# Patient Record
Sex: Female | Born: 1965 | Race: Black or African American | Hispanic: No | Smoking: Never smoker
Health system: Southern US, Community
[De-identification: ages and names within clinical notes are randomized; demographics above are authoritative.]

## PROBLEM LIST (undated history)

## (undated) DIAGNOSIS — M1990 Unspecified osteoarthritis, unspecified site: Secondary | ICD-10-CM

## (undated) DIAGNOSIS — N189 Chronic kidney disease, unspecified: Secondary | ICD-10-CM

## (undated) DIAGNOSIS — D649 Anemia, unspecified: Secondary | ICD-10-CM

## (undated) DIAGNOSIS — I10 Essential (primary) hypertension: Secondary | ICD-10-CM

## (undated) DIAGNOSIS — J45909 Unspecified asthma, uncomplicated: Secondary | ICD-10-CM

## (undated) HISTORY — DX: Anemia, unspecified: D64.9

## (undated) HISTORY — DX: Chronic kidney disease, unspecified: N18.9

## (undated) HISTORY — DX: Essential (primary) hypertension: I10

## (undated) HISTORY — PX: NO PAST SURGERIES: SHX2092

## (undated) HISTORY — DX: Unspecified osteoarthritis, unspecified site: M19.90

## (undated) HISTORY — DX: Unspecified asthma, uncomplicated: J45.909

## (undated) HISTORY — PX: TUBAL LIGATION: SHX77

---

## 1997-10-29 ENCOUNTER — Other Ambulatory Visit: Admission: RE | Admit: 1997-10-29 | Discharge: 1997-10-29 | Payer: Self-pay | Admitting: Obstetrics & Gynecology

## 1997-11-26 ENCOUNTER — Inpatient Hospital Stay (HOSPITAL_COMMUNITY): Admission: AD | Admit: 1997-11-26 | Discharge: 1997-11-28 | Payer: Self-pay | Admitting: Obstetrics and Gynecology

## 2003-03-09 ENCOUNTER — Emergency Department (HOSPITAL_COMMUNITY): Admission: EM | Admit: 2003-03-09 | Discharge: 2003-03-09 | Payer: Self-pay

## 2004-01-02 ENCOUNTER — Ambulatory Visit (HOSPITAL_COMMUNITY): Admission: RE | Admit: 2004-01-02 | Discharge: 2004-01-02 | Payer: Self-pay | Admitting: Obstetrics

## 2013-06-18 ENCOUNTER — Emergency Department (HOSPITAL_COMMUNITY): Payer: Self-pay

## 2013-06-18 ENCOUNTER — Encounter (HOSPITAL_COMMUNITY): Payer: Self-pay | Admitting: Emergency Medicine

## 2013-06-18 ENCOUNTER — Emergency Department (HOSPITAL_COMMUNITY)
Admission: EM | Admit: 2013-06-18 | Discharge: 2013-06-18 | Disposition: A | Discharge status: Home / Self Care | Payer: Self-pay | Attending: Emergency Medicine | Admitting: Emergency Medicine

## 2013-06-18 DIAGNOSIS — D649 Anemia, unspecified: Secondary | ICD-10-CM | POA: Insufficient documentation

## 2013-06-18 DIAGNOSIS — M542 Cervicalgia: Secondary | ICD-10-CM | POA: Insufficient documentation

## 2013-06-18 DIAGNOSIS — R599 Enlarged lymph nodes, unspecified: Secondary | ICD-10-CM | POA: Insufficient documentation

## 2013-06-18 DIAGNOSIS — K5900 Constipation, unspecified: Secondary | ICD-10-CM | POA: Insufficient documentation

## 2013-06-18 DIAGNOSIS — R634 Abnormal weight loss: Secondary | ICD-10-CM | POA: Insufficient documentation

## 2013-06-18 LAB — CBC WITH DIFFERENTIAL/PLATELET
BASOS ABS: 0.1 10*3/uL (ref 0.0–0.1)
Basophils Relative: 1 % (ref 0–1)
EOS PCT: 2 % (ref 0–5)
Eosinophils Absolute: 0.1 10*3/uL (ref 0.0–0.7)
HEMATOCRIT: 28.2 % — AB (ref 36.0–46.0)
HEMOGLOBIN: 8.3 g/dL — AB (ref 12.0–15.0)
Lymphocytes Relative: 34 % (ref 12–46)
Lymphs Abs: 1.8 10*3/uL (ref 0.7–4.0)
MCH: 22.8 pg — ABNORMAL LOW (ref 26.0–34.0)
MCHC: 29.4 g/dL — AB (ref 30.0–36.0)
MCV: 77.5 fL — ABNORMAL LOW (ref 78.0–100.0)
MONO ABS: 0.4 10*3/uL (ref 0.1–1.0)
Monocytes Relative: 7 % (ref 3–12)
NEUTROS ABS: 3 10*3/uL (ref 1.7–7.7)
Neutrophils Relative %: 57 % (ref 43–77)
Platelets: 302 10*3/uL (ref 150–400)
RBC: 3.64 MIL/uL — ABNORMAL LOW (ref 3.87–5.11)
RDW: 16.4 % — ABNORMAL HIGH (ref 11.5–15.5)
WBC: 5.4 10*3/uL (ref 4.0–10.5)

## 2013-06-18 LAB — POCT I-STAT, CHEM 8
BUN: 9 mg/dL (ref 6–23)
Calcium, Ion: 1.2 mmol/L (ref 1.12–1.23)
Chloride: 102 mEq/L (ref 96–112)
Creatinine, Ser: 0.8 mg/dL (ref 0.50–1.10)
Glucose, Bld: 81 mg/dL (ref 70–99)
HEMATOCRIT: 32 % — AB (ref 36.0–46.0)
Hemoglobin: 10.9 g/dL — ABNORMAL LOW (ref 12.0–15.0)
POTASSIUM: 4.1 meq/L (ref 3.7–5.3)
SODIUM: 139 meq/L (ref 137–147)
TCO2: 26 mmol/L (ref 0–100)

## 2013-06-18 MED ORDER — IOHEXOL 300 MG/ML  SOLN
100.0000 mL | Freq: Once | INTRAMUSCULAR | Status: AC | PRN
  Administered 2013-06-18: 100 mL via INTRAVENOUS

## 2013-06-18 NOTE — ED Notes (Signed)
Pt c/o "knot" on R posterior neck increasing in size and pain x 6 months.  Pain score 4/10.

## 2013-06-18 NOTE — Discharge Instructions (Signed)
Read the information below.  You may return to the Emergency Department at any time for worsening condition or any new symptoms that concern you. If you become weak, lightheaded, short of breath, you pass out, or have uncontrolled bleeding, return to the ER immediately for a recheck.  Anemia, Nonspecific Anemia is a condition in which the concentration of red blood cells or hemoglobin in the blood is below normal. Hemoglobin is a substance in red blood cells that carries oxygen to the tissues of the body. Anemia results in not enough oxygen reaching these tissues.  CAUSES  Common causes of anemia include:   Excessive bleeding. Bleeding may be internal or external. This includes excessive bleeding from periods (in women) or from the intestine.   Poor nutrition.   Chronic kidney, thyroid, and liver disease.  Bone marrow disorders that decrease red blood cell production.  Cancer and treatments for cancer.  HIV, AIDS, and their treatments.  Spleen problems that increase red blood cell destruction.  Blood disorders.  Excess destruction of red blood cells due to infection, medicines, and autoimmune disorders. SIGNS AND SYMPTOMS   Minor weakness.   Dizziness.   Headache.  Palpitations.   Shortness of breath, especially with exercise.   Paleness.  Cold sensitivity.  Indigestion.  Nausea.  Difficulty sleeping.  Difficulty concentrating. Symptoms may occur suddenly or they may develop slowly.  DIAGNOSIS  Additional blood tests are often needed. These help your health care provider determine the best treatment. Your health care provider will check your stool for blood and look for other causes of blood loss.  TREATMENT  Treatment varies depending on the cause of the anemia. Treatment can include:   Supplements of iron, vitamin B12, or folic acid.   Hormone medicines.   A blood transfusion. This may be needed if blood loss is severe.   Hospitalization. This  may be needed if there is significant continual blood loss.   Dietary changes.  Spleen removal. HOME CARE INSTRUCTIONS Keep all follow-up appointments. It often takes many weeks to correct anemia, and having your health care provider check on your condition and your response to treatment is very important. SEEK IMMEDIATE MEDICAL CARE IF:   You develop extreme weakness, shortness of breath, or chest pain.   You become dizzy or have trouble concentrating.  You develop heavy vaginal bleeding.   You develop a rash.   You have bloody or black, tarry stools.   You faint.   You vomit up blood.   You vomit repeatedly.   You have abdominal pain.  You have a fever or persistent symptoms for more than 2 3 days.   You have a fever and your symptoms suddenly get worse.   You are dehydrated.  MAKE SURE YOU:  Understand these instructions.  Will watch your condition.  Will get help right away if you are not doing well or get worse. Document Released: 06/24/2004 Document Revised: 01/17/2013 Document Reviewed: 11/10/2012 [facility] Patient Information 2014 [place].   Emergency Department Resource Guide 1) Find a Doctor and Pay Out of Pocket Although you won't have to find out who is covered by your insurance plan, it is a good idea to ask around and get recommendations. You will then need to call the office and see if the doctor you have chosen will accept you as a new patient and what types of options they offer for patients who are self-pay. Some doctors offer discounts or will set up payment plans for their patients  who do not have insurance, but you will need to ask so you aren't surprised when you get to your appointment.  2) Contact Your Local Health Department Not all health departments have doctors that can see patients for sick visits, but many do, so it is worth a call to see if yours does. If you don't know where your local health department is, you can  check in your phone book. The CDC also has a tool to help you locate your state's health department, and many state websites also have listings of all of their local health departments.  3) Find a Walk-in Clinic If your illness is not likely to be very severe or complicated, you may want to try a walk in clinic. These are popping up all over the country in pharmacies, drugstores, and shopping centers. They're usually staffed by nurse practitioners or physician assistants that have been trained to treat common illnesses and complaints. They're usually fairly quick and inexpensive. However, if you have serious medical issues or chronic medical problems, these are probably not your best option.  No Primary Care Doctor: - Call Health Connect at  [phone] - they can help you locate a primary care doctor that  accepts your insurance, provides certain services, etc. - Physician Referral Service- [phone]  Chronic Pain Problems: Organization         Address  Phone   Notes  [facility]  [phone] Patients need to be referred by their primary care doctor.   Medication Assistance: Organization         Address  Phone   Notes  [facility] [address] [place], [place] [address] [phone] --Must be a resident of [facility] -- Must have NO insurance coverage whatsoever (no Medicaid/ Medicare, etc.) -- The pt. MUST have a primary care doctor that directs their care regularly and follows them in the community   [facility]  [phone]   [employer]  [phone]    Agencies that provide inexpensive medical care: Organization         Address  Phone   Notes  [facility]  [phone]   [facility]    [phone]   [facility] [address] [place], [place] [address] [phone]   [facility] [address] [place] [place] [phone]    Planned Parenthood    [phone]   [facility]    [phone]   [facility] and [facility]  [address], [place] Phone:  [phone], Fax:  [phone] Hours of Operation:  9 am - 6 pm, M-F.  Also accepts Medicaid/Medicare and self-pay.  [facility]  [address], [place] Phone: [phone], Fax: [phone]. Hours of Operation:  8:30 am - 5:30 pm, M-F.  Also accepts Medicaid and self-pay.  [facility] [place] [address], [place] Phone: [phone]   [facility] [address] Natasha Bence [place], [place] [phone], Ext. [phone] Mondays & Thursdays: 7-9 AM.  First 15 patients are seen on a first come, first serve basis.    Medicaid-accepting [facility] Providers:  Organization         Address  Phone   Notes  [facility] [address], [place] [phone] Also accepts self-pay patients.  [facility] [address] Laurell Josephs [place], [place]  [phone]   [facility] [address], [place] [phone]   [facility] [address], [place] [phone]   Renaye Rakers [address], [place]   [phone] Only accepts [place] Access [place] patients after they have their name applied to their card.   Self-Pay (no insurance) in [facility]:  Organization         Address  Phone   Notes  Sickle Cell Patients, [facility] Internal Medicine [address] [place], [place] [phone]   [facility] [address] [place], [place] [phone]   [facility] [place]  [address], [place] [phone]   [facility]/Dr. Osei-Bonsu  [address] [place] or [address], [place] [phone] Phone number for both [place] and [place] locations is the  same.  [facility]gent [facility] and [facility] [address] [place] [phone]   [facility] [address] [place] or [address] [phone]   [facility] [address] [place] [phone], phone; [phone], fax Sees patients 1st and 3rd Saturday of every month.  Must not qualify for public or private insurance (i.e. Medicaid, Medicare, [place] Health Choice, Veterans' Benefits)  Household income should be no more than 200% of the poverty level The clinic cannot treat you if you are pregnant or think you are pregnant  Sexually transmitted diseases are not treated at the clinic.    Dental Care: Organization         Address  Phone  Notes  [facility] Department of [facility] [address] [place], [place] [phone] Accepts children up to age 5 who are enrolled in [place] or [place] Health Choice; pregnant women with a Medicaid card; and children who have applied for Medicaid or [place] Health Choice, but were declined, whose parents can pay a reduced fee at time of service.  [facility] Department of [facility]  [address], [place] [phone] Accepts children up to age 59 who are enrolled in [place] or [place] Health Choice; pregnant women with a Medicaid card; and children who have applied for Medicaid or [place] Health Choice, but were declined, whose parents can pay a reduced fee at time of service.  [facility]  [address] [place], [place] [phone] Patients are seen by appointment only. Walk-ins are not accepted. [facility] will see patients 79 years of age and older. Monday - Tuesday (8am-5pm) Most Wednesdays (8:30-5pm) $30 per visit, cash only  [facility] Adult Dental Access PROGRAM  [address], [facility] [phone] Patients are seen by appointment only. Walk-ins are not accepted. [facility] will see patients  47 years of age and older. One Wednesday Evening (Monthly: Volunteer Based).  $30 per visit, cash only  [employer]  [phone] for adults; Children under age 75, call Graduate Pediatric Dentistry at [phone]. Children aged 2-14, please call [phone] to request a pediatric application.  Dental services are provided in all areas of dental care including fillings, crowns and bridges, complete and partial dentures, implants, gum treatment, root canals, and extractions. Preventive care is also provided. Treatment is provided to both adults and children.  Patients are selected via a lottery and there is often a waiting list.   [facility] [address], [place]  [phone] [email or web address]   [facility] [address] [place], [place] [phone], Ext. [phone] Second and Fourth Thursday of each month, opens at 6:30 AM; Clinic ends at 9 AM.  Patients are seen on a first-come first-served basis, and a limited number are seen during each clinic.   [facility]  [address] Ether Griffins [place], [place] [phone]   Eligibility Requirements You must have lived in [place], [place], or [place] counties for at least the last three months.   You cannot be eligible for state or federal sponsored [place], including [employer], [place], or [employer].   You generally cannot be eligible for healthcare insurance through your employer.    How to apply: Eligibility screenings are held every Tuesday and Wednesday afternoon from 1:00 pm until 4:00 pm. You do not need an appointment for the interview!  [facility] [address] [place], [place] [phone]   [facility] Health Department  [phone]   [facility] Health Department  [phone]   [facility] Health Department  [phone]    Behavioral Health Resources in the Community: Intensive Outpatient  Programs Organization         Address  Phone  Notes  [facility] Services [address] [place], [place] [phone]   [facility] Outpatient [address] [place], [place] [phone]   [facility]: [facility] [address], [place], [place]  [phone]   [facility] Mental Health [address]  [place], [place] [phone] or [phone]   Substance Abuse Resources Organization         Address  Phone  Notes  [facility] Drug Services  [phone]   [facility]  [phone]   [facility]  [phone]   Floydene Flock  [phone]   Residential & Outpatient Substance Abuse Program  [phone]   Psychological Services Organization         Address  Phone  Notes  [facility]al Health  [phone]   [facility]  [phone]   [facility] Mental Health [address] [place] [phone] or [phone]    Mobile Crisis Teams Organization         Address  Phone  Notes  [facility], Mobile Crisis Care Unit  [phone]   [facility]  [address] [place], [place] [phone]   Doristine Locks [address] [place] [phone]    Self-Help/Support Groups Organization         Address  Phone             Notes  [facility] [place] - variety of support groups  [phone]- I7437963 Call for more information  Narcotics Anonymous (NA), [facility] [address] [place] [address], [employer] [place]  2 meetings at this location   Statistician         Address  Phone  Notes  [facility] [address],    [place]  [phone]   [facility]  [address] [place] [address], [place], [place] [phone]   [facility] [address] [place], [place] [phone] Admissions: 8am-3pm M-F  [facility] [address]    [place], [place]  [phone]   [facility]  [address] Leonard Schwartz [place], [place] [phone]   [facility] [address],  [place], [place] [phone]   [facility] - Intensive Outpatient [address] Laurell Josephs [address], [place], [place] [phone]   [facility] (Addiction Recovery [facility] [address] [place]  [place], [place] [phone] or [phone]   [facility] ([facility]) [address], [place], [place] [phone] Accepts Medicaid  [facility] [address]  [place] [phone] Substance Abuse/Addiction Treatment   [facility] Organization         Address  Phone  Notes  [facility]  [phone]   Angie Fava, PhD [address] Ervin Knack [place], [place]   [phone] or [phone]   [facility] Behavioral   [address] [place], [place] [phone]   [facility] [address], [place], [place] [phone] Insurance/Medicaid/sponsorship through [facility] and Families [address]                                    [place], [place] [phone] Therapy/tele-psych/case  [facility] [address] [place], [place] [phone]    Dr. Lolly Mustache  [phone]   [facility]  [employer] [facility] Dept. 1) [address] [place] 2) [address], [place] 3)  [address], [place] [phone]  [phone]   [facility] Child Abuse Hotline 718-788-7278 or [phone] (After Hours)

## 2013-06-18 NOTE — ED Provider Notes (Signed)
Medical screening examination/treatment/procedure(s) were performed by non-physician practitioner and as supervising physician I was immediately available for consultation/collaboration.  EKG Interpretation   None        Alecxander Mainwaring R. Lyndon Chapel, MD 06/18/13 2354 

## 2013-06-18 NOTE — ED Provider Notes (Signed)
CSN: [id number]     Arrival date & time 06/18/13  1320 History  This chart was scribed for non-physician practitioner, Trixie Dredge, PA-C working with Ethelda Chick, MD by Greggory Stallion, ED scribe. This patient was seen in room WTR8/WTR8 and the patient's care was started at 2:27 PM.   Chief Complaint  Patient presents with  . Knot on neck    The history is provided by the patient. No language interpreter was used.   HPI Comments: Tammy Huang is a 48 y.o. female who presents to the Emergency Department complaining of a knot to the right side of her neck that started 9 months ago as a small lump that has gradually grown in size. Pt states there was no pain when she first noticed the bump. She states it has grown in size over the last 3-4 months and she is now having increased pain to the area. Pt states she has lost 30 pounds over the last year without trying. She has not changed what she was eating. Pt has been getting full faster than usual. Her last bowel movement was over one week ago. Denies any scalp lesions. Denies fever, chills, night sweats, dental pain, sore throat, congestion, sinus pressure, ear pain, ear discharge, cough, hemoptysis, SOB, abdominal pain, hematochezia, generalized body aches, weakness or numbness in her arm. Pt states she has been anemic for the last year and has intermittent light headedness because of it. She states she normally has very heavy menses. Denies black, tarry stools, weakness. Denies any other source of bleeding. Denies history of smoking cigarettes.   History reviewed. No pertinent past medical history. History reviewed. No pertinent past surgical history. History reviewed. No pertinent family history. History  Substance Use Topics  . Smoking status: Never Smoker   . Smokeless tobacco: Never Used  . Alcohol Use: No   OB History   Grav Para Term Preterm Abortions TAB SAB Ect Mult Living                 Review of Systems  Constitutional: Positive  for unexpected weight change. Negative for fever, chills and diaphoresis.  HENT: Negative for congestion, dental problem, ear discharge, ear pain, sinus pressure and sore throat.   Respiratory: Negative for cough and shortness of breath.   Gastrointestinal: Positive for constipation. Negative for abdominal pain and blood in stool.  Musculoskeletal: Positive for neck pain. Negative for myalgias.  Neurological: Negative for weakness and numbness.  All other systems reviewed and are negative.    Allergies  Review of patient's allergies indicates not on file.  Home Medications  No current outpatient prescriptions on file.  BP 142/87  Pulse 83  Temp(Src) 98.6 F (37 C) (Oral)  Resp 16  SpO2 100%  LMP 06/12/2013  Physical Exam  Nursing note and vitals reviewed. Constitutional: She appears well-developed and well-nourished. No distress.  HENT:  Head: Normocephalic and atraumatic.  Right Ear: Tympanic membrane and ear canal normal.  Left Ear: Tympanic membrane and ear canal normal.  Mouth/Throat: No oral lesions.  No tenderness of the scalp. No lesions noted. No lesions in the oropharynx.   Neck: Neck supple.  Pulmonary/Chest: Effort normal.  Lymphadenopathy:  No preauricular or postauricular lymphadenopathy. No supraclavicular lympha. No right axillary or right epitrochlear lymphadenopathy. No occiptal lymphadenopathy. Enlarged lymph node on right posterior chain. It is tender and mobile.   Neurological: She is alert.  Skin: She is not diaphoretic.    ED Course  Procedures (including critical  care time)  DIAGNOSTIC STUDIES: Oxygen Saturation is 100% on RA, normal by my interpretation.    COORDINATION OF CARE: 2:36 PM-Discussed treatment plan which includes CT scan with pt at bedside and pt agreed to plan.   Labs Review Labs Reviewed  CBC WITH DIFFERENTIAL - Abnormal; Notable for the following:    RBC 3.64 (*)    Hemoglobin 8.3 (*)    HCT 28.2 (*)    MCV 77.5 (*)     MCH 22.8 (*)    MCHC 29.4 (*)    RDW 16.4 (*)    All other components within normal limits  POCT I-STAT, CHEM 8 - Abnormal; Notable for the following:    Hemoglobin 10.9 (*)    HCT 32.0 (*)    All other components within normal limits   Imaging Review Ct Soft Tissue Neck W Contrast  06/18/2013   CLINICAL DATA:  Knot in right side of neck and right shoulder pain.  EXAM: CT NECK WITH CONTRAST  TECHNIQUE: Multidetector CT imaging of the neck was performed using the standard protocol following the bolus administration of intravenous contrast.  CONTRAST:  100mL OMNIPAQUE IOHEXOL 300 MG/ML  SOLN  COMPARISON:  None.  FINDINGS: Suprahyoid neck:  Normal.  Larynx:  Normal.  Infrahyoid neck:  Normal.  Lymph nodes: There are no lymph nodes visualized with short axis greater than 7 mm. A shotty posterior cervical lymph node level V on the right measures 5 mm short axis.  Upper chest/mediastinum: No pathologic abnormality. There is a prominent venous structure lateral to the aorta incompletely evaluated suggesting left superior intercostal vein or aortic nipple.  Additional: Normal osseous structures. Visualized intracranial compartment unremarkable. No sinus, mastoid, or orbital findings.  IMPRESSION: No pathologic adenopathy or posterior cervical mass.   Electronically Signed   By: Davonna BellingJohn  Curnes M.D.   On: 06/18/2013 15:25    EKG Interpretation   None       MDM   1. Neck pain on right side   2. Anemia    Pt without PCP with right posterior cervical lymph node that she reports has steadily grown in size over 9 months without any apparent source of infection.  She has had some unexpected weight loss but no other constitutional/"B" symptoms.  As she had no PCP for follow up, I ordered CBC, chem 8, CT neck for further evaluation.  CT was unremarkable, lymph node measures very small, no abnormal appearance.  Labs remarkable only for anemia, which patient is aware of and is having no symptoms from.  I did  encourage her to follow up with PCP and gynecology.  Resources given.  D/C home.  Discussed result, findings, treatment, and follow up  with patient.  Pt given return precautions.  Pt verbalizes understanding and agrees with plan.      I personally performed the services described in this documentation, which was scribed in my presence. The recorded information has been reviewed and is accurate.   Trixie Dredgemily Malky Rudzinski, PA-C 06/18/13 1811

## 2015-12-10 ENCOUNTER — Other Ambulatory Visit: Payer: Self-pay | Admitting: Family Medicine

## 2015-12-10 DIAGNOSIS — Z1231 Encounter for screening mammogram for malignant neoplasm of breast: Secondary | ICD-10-CM

## 2015-12-11 ENCOUNTER — Encounter: Payer: Self-pay | Admitting: Gastroenterology

## 2015-12-16 ENCOUNTER — Ambulatory Visit
Admission: RE | Admit: 2015-12-16 | Discharge: 2015-12-16 | Disposition: A | Discharge status: Home / Self Care | Payer: BC Managed Care – PPO | Source: Ambulatory Visit | Attending: Family Medicine | Admitting: Family Medicine

## 2015-12-16 DIAGNOSIS — Z1231 Encounter for screening mammogram for malignant neoplasm of breast: Secondary | ICD-10-CM

## 2016-02-03 ENCOUNTER — Ambulatory Visit (AMBULATORY_SURGERY_CENTER): Payer: Self-pay

## 2016-02-03 VITALS — Ht 62.0 in | Wt 230.0 lb

## 2016-02-03 DIAGNOSIS — Z1211 Encounter for screening for malignant neoplasm of colon: Secondary | ICD-10-CM

## 2016-02-03 MED ORDER — SUPREP BOWEL PREP KIT 17.5-3.13-1.6 GM/177ML PO SOLN
1.0000 | Freq: Once | ORAL | 0 refills | Status: AC
Start: 2016-02-03 — End: 2016-02-03

## 2016-02-03 NOTE — Progress Notes (Signed)
No allergies to eggs or soy No home oxygen No diet meds No past exposure to anesthesia  Declined emmi 

## 2016-02-04 ENCOUNTER — Encounter: Payer: Self-pay | Admitting: Gastroenterology

## 2016-02-17 ENCOUNTER — Ambulatory Visit (AMBULATORY_SURGERY_CENTER): Payer: BC Managed Care – PPO | Admitting: Gastroenterology

## 2016-02-17 ENCOUNTER — Encounter: Payer: Self-pay | Admitting: Gastroenterology

## 2016-02-17 VITALS — BP 136/88 | HR 56 | Temp 98.0°F | Resp 12 | Ht 62.0 in | Wt 230.0 lb

## 2016-02-17 DIAGNOSIS — Z1211 Encounter for screening for malignant neoplasm of colon: Secondary | ICD-10-CM

## 2016-02-17 MED ORDER — SODIUM CHLORIDE 0.9 % IV SOLN
500.0000 mL | INTRAVENOUS | Status: AC
Start: 2016-02-17 — End: ?

## 2016-02-17 NOTE — Op Note (Signed)
[facility] Patient Name: Tammy Huang Procedure Date: 02/17/2016 9:22 AM MRN: [id number] Endoscopist: Meryl Dare , MD Age: 50 Referring MD:  Date of Birth: 1965/08/31 Gender: Female Account #: [id number] Procedure:                Colonoscopy Indications:              Screening for colorectal malignant neoplasm Medicines:                Monitored Anesthesia Care Procedure:                Pre-Anesthesia Assessment:                           - Prior to the procedure, a History and Physical                            was performed, and patient medications and                            allergies were reviewed. The patient's tolerance of                            previous anesthesia was also reviewed. The risks                            and benefits of the procedure and the sedation                            options and risks were discussed with the patient.                            All questions were answered, and informed consent                            was obtained. Prior Anticoagulants: The patient has                            taken no previous anticoagulant or antiplatelet                            agents. ASA Grade Assessment: II - A patient with                            mild systemic disease. After reviewing the risks                            and benefits, the patient was deemed in                            satisfactory condition to undergo the procedure.                           After obtaining informed consent, the colonoscope  was passed under direct vision. Throughout the                            procedure, the patient's blood pressure, pulse, and                            oxygen saturations were monitored continuously. The                            Model PCF-H190L [id number]) scope was introduced                            through the anus and advanced to the the cecum,                            identified by  appendiceal orifice and ileocecal                            valve. The ileocecal valve, appendiceal orifice,                            and rectum were photographed. The quality of the                            bowel preparation was good. The colonoscopy was                            performed without difficulty. The patient tolerated                            the procedure well. Scope In: 9:28:55 AM Scope Out: 9:39:27 AM Scope Withdrawal Time: 0 hours 8 minutes 43 seconds  Total Procedure Duration: 0 hours 10 minutes 32 seconds  Findings:                 The entire examined colon appeared normal on direct                            and retroflexion views. Complications:            No immediate complications. Estimated blood loss:                            None. Estimated Blood Loss:     Estimated blood loss: none. Impression:               - The entire examined colon is normal on direct and                            retroflexion views.                           - No specimens collected. Recommendation:           - Repeat colonoscopy in 10 years for screening  purposes.                           - Patient has a contact number available for                            emergencies. The signs and symptoms of potential                            delayed complications were discussed with the                            patient. Return to normal activities tomorrow.                            Written discharge instructions were provided to the                            patient.                           - Resume previous diet.                           - Continue present medications. Meryl Dare, MD 02/17/2016 9:42:16 AM This report has been signed electronically.

## 2016-02-17 NOTE — Progress Notes (Signed)
To recovery, report to McCoy, RN, VSS 

## 2016-02-17 NOTE — Patient Instructions (Signed)
Discharge instructions given. Normal exam. Resume previous medications. YOU HAD AN ENDOSCOPIC PROCEDURE TODAY AT THE [facility]:   Refer to the procedure report that was given to you for any specific questions about what was found during the examination.  If the procedure report does not answer your questions, please call your gastroenterologist to clarify.  If you requested that your care partner not be given the details of your procedure findings, then the procedure report has been included in a sealed envelope for you to review at your convenience later.  YOU SHOULD EXPECT: Some feelings of bloating in the abdomen. Passage of more gas than usual.  Walking can help get rid of the air that was put into your GI tract during the procedure and reduce the bloating. If you had a lower endoscopy (such as a colonoscopy or flexible sigmoidoscopy) you may notice spotting of blood in your stool or on the toilet paper. If you underwent a bowel prep for your procedure, you may not have a normal bowel movement for a few days.  Please Note:  You might notice some irritation and congestion in your nose or some drainage.  This is from the oxygen used during your procedure.  There is no need for concern and it should clear up in a day or so.  SYMPTOMS TO REPORT IMMEDIATELY:   Following lower endoscopy (colonoscopy or flexible sigmoidoscopy):  Excessive amounts of blood in the stool  Significant tenderness or worsening of abdominal pains  Swelling of the abdomen that is new, acute  Fever of 100F or higher   For urgent or emergent issues, a gastroenterologist can be reached at any hour by calling [phone].   DIET:  We do recommend a small meal at first, but then you may proceed to your regular diet.  Drink plenty of fluids but you should avoid alcoholic beverages for 24 hours.  ACTIVITY:  You should plan to take it easy for the rest of today and you should NOT DRIVE or use heavy machinery  until tomorrow (because of the sedation medicines used during the test).    FOLLOW UP: Our staff will call the number listed on your records the next business day following your procedure to check on you and address any questions or concerns that you may have regarding the information given to you following your procedure. If we do not reach you, we will leave a message.  However, if you are feeling well and you are not experiencing any problems, there is no need to return our call.  We will assume that you have returned to your regular daily activities without incident.  If any biopsies were taken you will be contacted by phone or by letter within the next 1-3 weeks.  Please call us at [phone] if you have not heard about the biopsies in 3 weeks.    SIGNATURES/CONFIDENTIALITY: You and/or your care partner have signed paperwork which will be entered into your electronic medical record.  These signatures attest to the fact that that the information above on your After Visit Summary has been reviewed and is understood.  Full responsibility of the confidentiality of this discharge information lies with you and/or your care-partner. 

## 2016-02-18 ENCOUNTER — Telehealth: Payer: Self-pay

## 2016-02-18 NOTE — Telephone Encounter (Signed)
  Follow up Call-  Call back number 02/17/2016  Post procedure Call Back phone  # [phone]  Permission to leave phone message Yes  Some recent data might be hidden     Patient questions:  Do you have a fever, pain , or abdominal swelling? No. Pain Score  0 *  Have you tolerated food without any problems? Yes.    Have you been able to return to your normal activities? Yes.    Do you have any questions about your discharge instructions: Diet   No. Medications  No. Follow up visit  No.  Do you have questions or concerns about your Care? No.  Actions: * If pain score is 4 or above: No action needed, pain <4.

## 2016-12-15 ENCOUNTER — Other Ambulatory Visit: Payer: Self-pay | Admitting: Family Medicine

## 2016-12-15 DIAGNOSIS — Z1231 Encounter for screening mammogram for malignant neoplasm of breast: Secondary | ICD-10-CM

## 2016-12-29 ENCOUNTER — Ambulatory Visit
Admission: RE | Admit: 2016-12-29 | Discharge: 2016-12-29 | Disposition: A | Discharge status: Home / Self Care | Payer: BC Managed Care – PPO | Source: Ambulatory Visit | Attending: Family Medicine | Admitting: Family Medicine

## 2016-12-29 DIAGNOSIS — Z1231 Encounter for screening mammogram for malignant neoplasm of breast: Secondary | ICD-10-CM

## 2017-06-16 ENCOUNTER — Other Ambulatory Visit: Payer: Self-pay | Admitting: Family Medicine

## 2017-06-16 DIAGNOSIS — Z1231 Encounter for screening mammogram for malignant neoplasm of breast: Secondary | ICD-10-CM

## 2017-07-28 ENCOUNTER — Other Ambulatory Visit: Payer: Self-pay | Admitting: Family Medicine

## 2017-07-28 ENCOUNTER — Ambulatory Visit
Admission: RE | Admit: 2017-07-28 | Discharge: 2017-07-28 | Disposition: A | Discharge status: Home / Self Care | Payer: BC Managed Care – PPO | Source: Ambulatory Visit | Attending: Family Medicine | Admitting: Family Medicine

## 2017-07-28 DIAGNOSIS — M170 Bilateral primary osteoarthritis of knee: Secondary | ICD-10-CM

## 2017-12-30 ENCOUNTER — Ambulatory Visit: Payer: BC Managed Care – PPO

## 2018-01-25 ENCOUNTER — Ambulatory Visit
Admission: RE | Admit: 2018-01-25 | Discharge: 2018-01-25 | Disposition: A | Discharge status: Home / Self Care | Payer: BC Managed Care – PPO | Source: Ambulatory Visit | Attending: Family Medicine | Admitting: Family Medicine

## 2018-01-25 DIAGNOSIS — Z1231 Encounter for screening mammogram for malignant neoplasm of breast: Secondary | ICD-10-CM

## 2019-04-12 IMAGING — MG DIGITAL SCREENING BILATERAL MAMMOGRAM WITH CAD
4 series · 4 of 4 positions shown · non-contrast
Comparison: Previous exam(s).

CLINICAL DATA: Screening.

EXAM:
DIGITAL SCREENING BILATERAL MAMMOGRAM WITH CAD

[L CC]
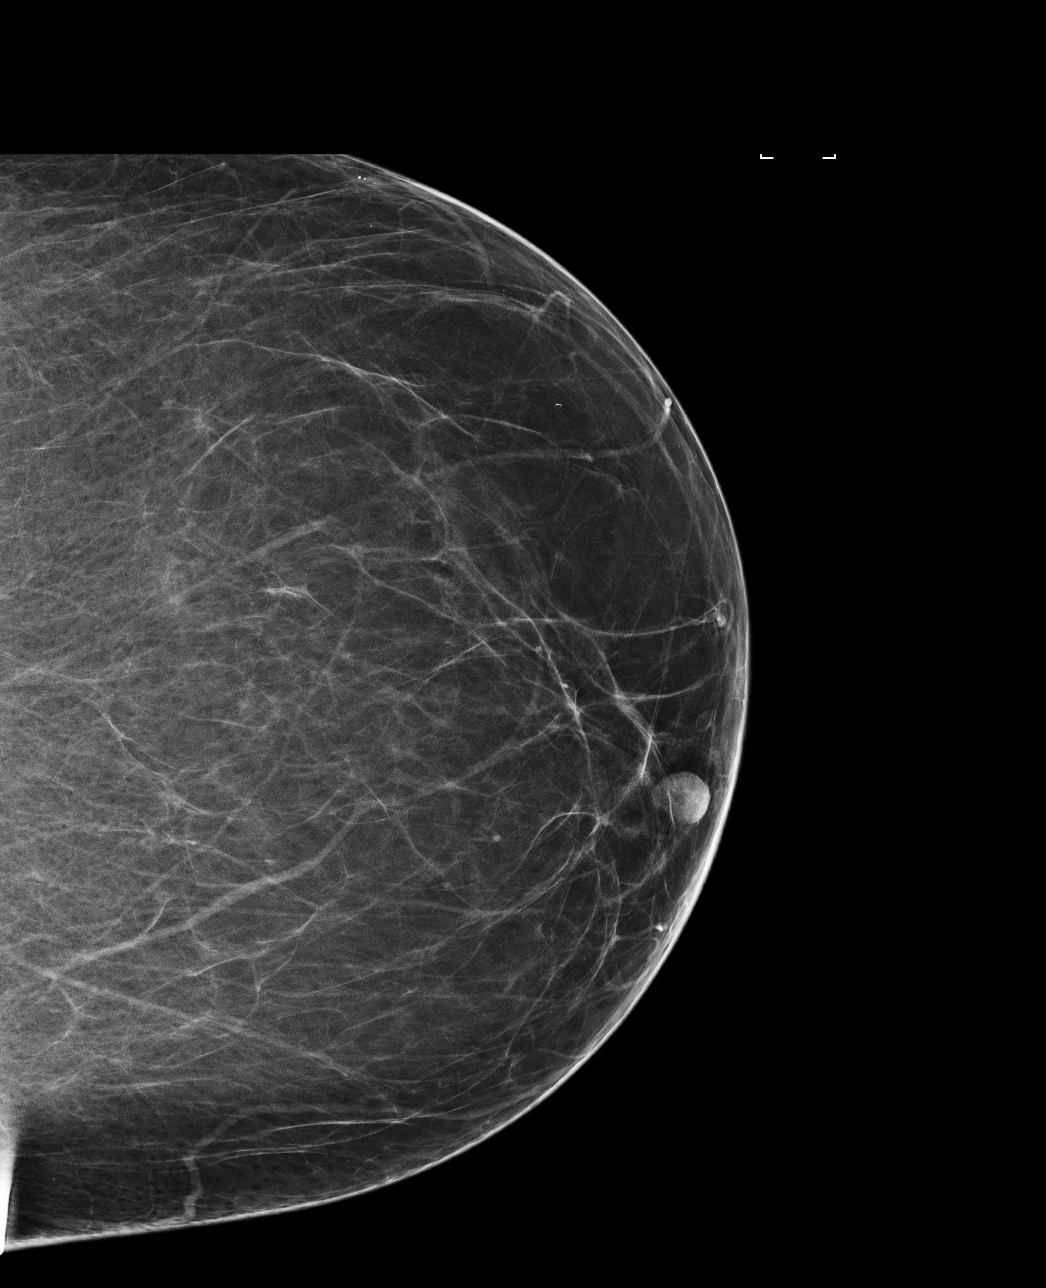

[L MLO]
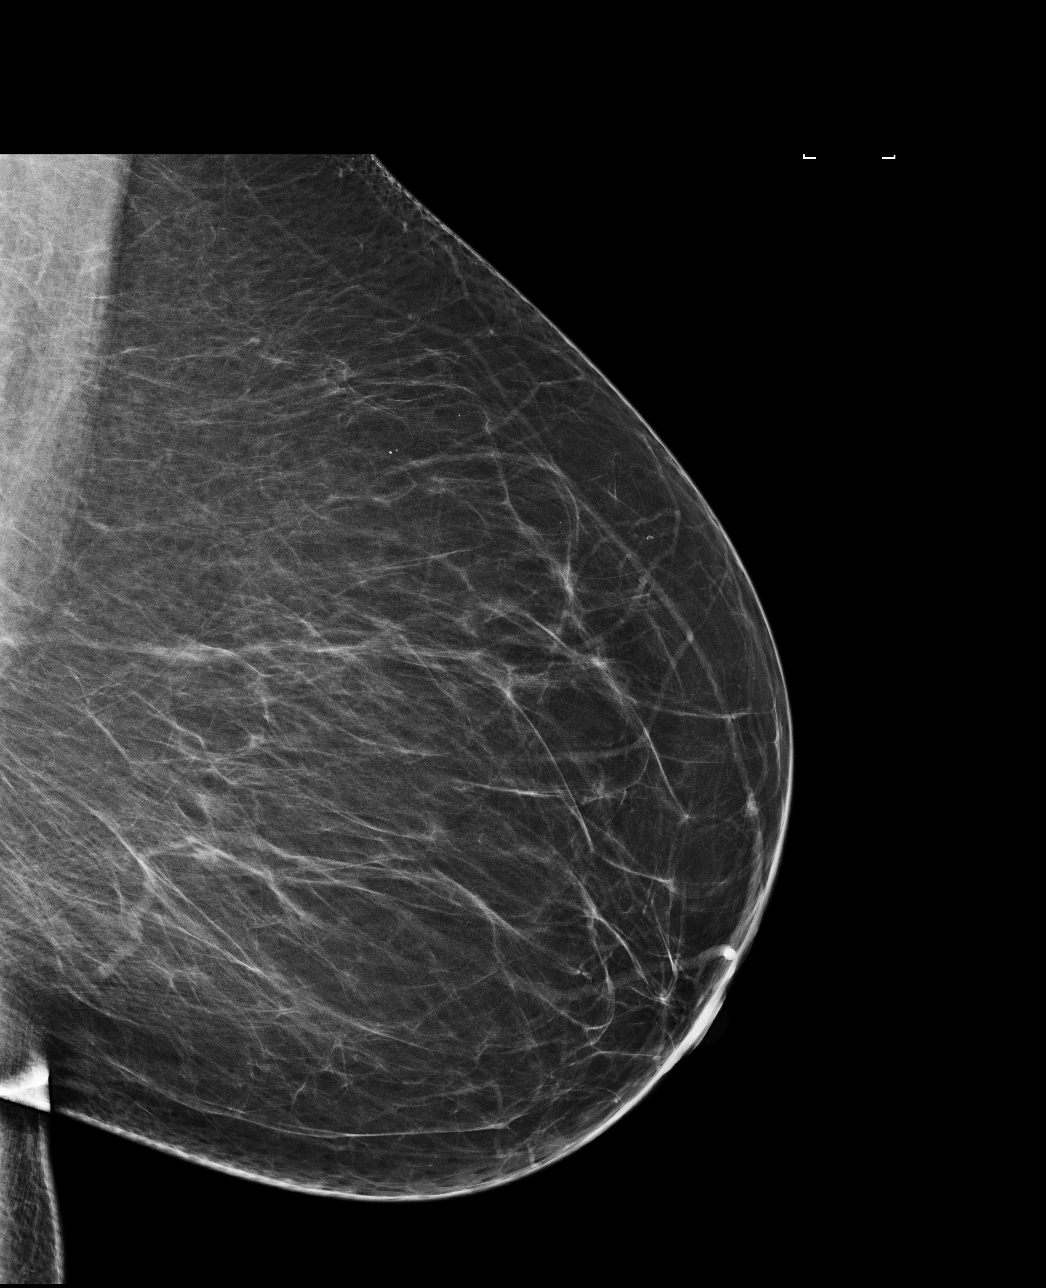

[R CC]
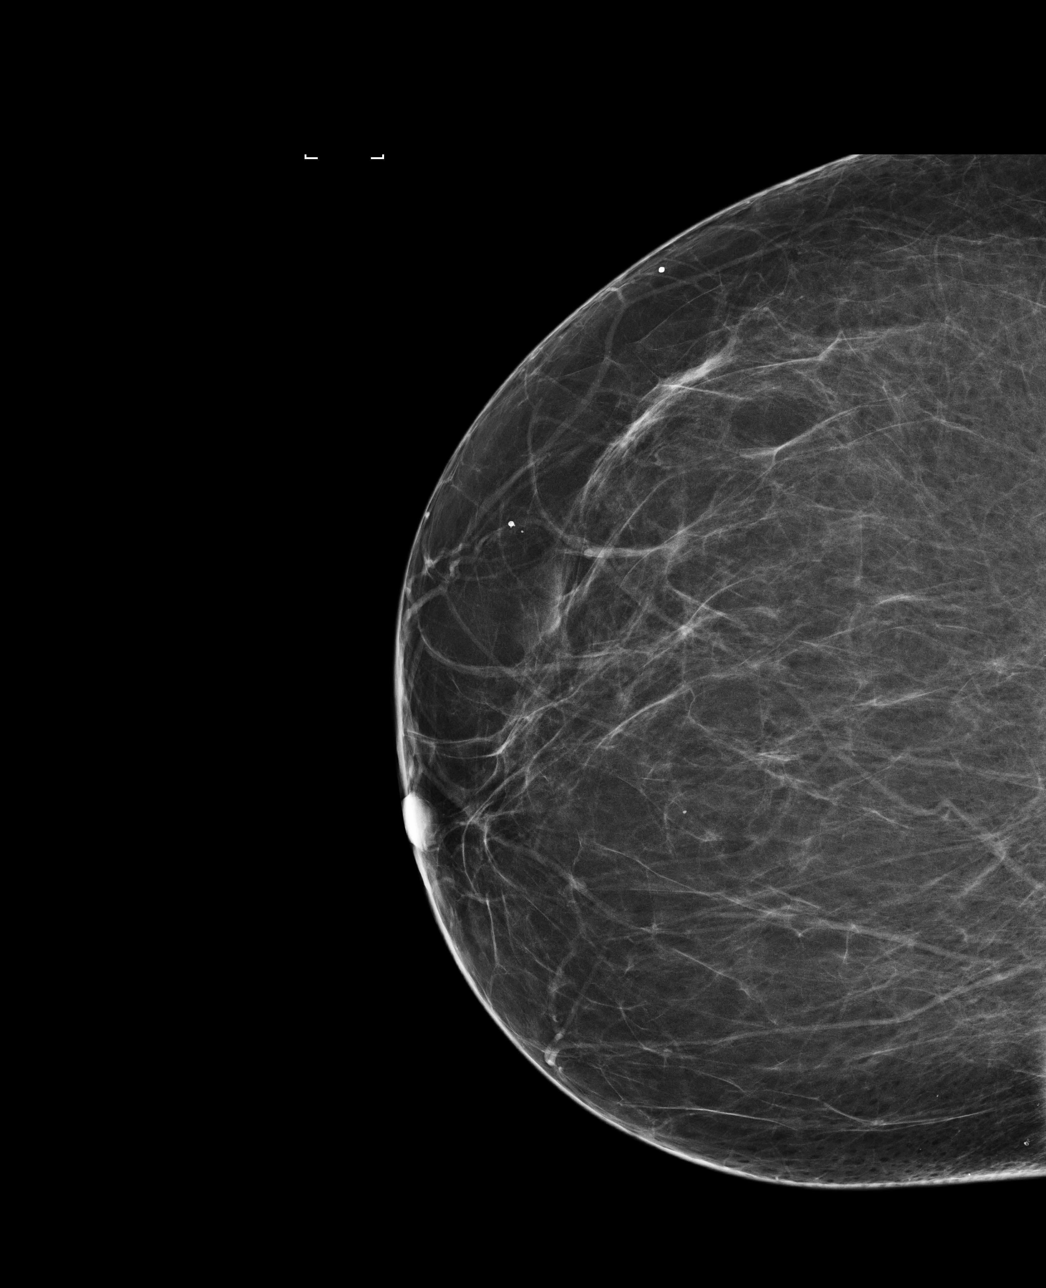

[R MLO]
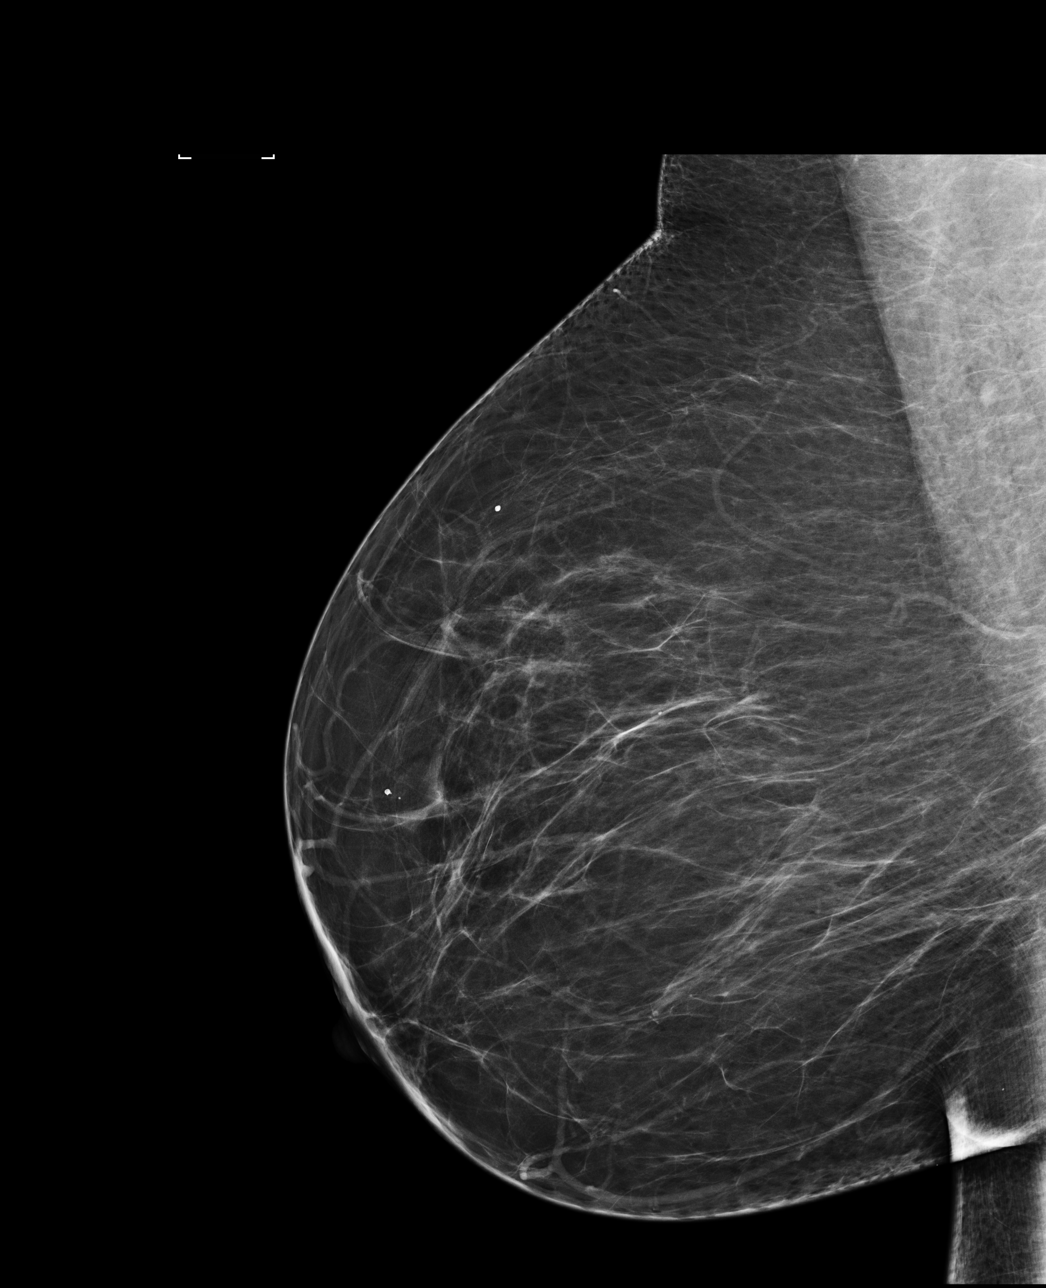

[4 of 4 positions shown; findings below may reference images not displayed]

ACR Breast Density Category b: There are scattered areas of
fibroglandular density.
FINDINGS: There are no findings suspicious for malignancy. Images were
processed with CAD.
IMPRESSION: No mammographic evidence of malignancy. A result letter of this
screening mammogram will be mailed directly to the patient.

RECOMMENDATION:
Screening mammogram in one year. (Code:AS-G-LCT)

BI-RADS CATEGORY  1: Negative.

## 2019-08-02 ENCOUNTER — Other Ambulatory Visit: Payer: Self-pay

## 2019-08-02 ENCOUNTER — Ambulatory Visit: Payer: Self-pay | Attending: Family

## 2019-08-02 DIAGNOSIS — Z23 Encounter for immunization: Secondary | ICD-10-CM | POA: Insufficient documentation

## 2019-08-02 NOTE — Progress Notes (Signed)
   Covid-19 Vaccination Clinic  Name:  KAWEHI HOSTETTER    MRN: [id number] DOB: 1965/07/19  08/02/2019  Ms. Lichtenwalner was observed post Covid-19 immunization for 15 minutes without incident. She was provided with Vaccine Information Sheet and instruction to access the V-Safe system.   Ms. Ramaswamy was instructed to call 911 with any severe reactions post vaccine: Marland Kitchen Difficulty breathing  . Swelling of face and throat  . A fast heartbeat  . A bad rash all over body  . Dizziness and weakness   Immunizations Administered    Name Date Dose VIS Date Route   Moderna COVID-19 Vaccine 08/02/2019  5:09 PM 0.5 mL 05/01/2019 Intramuscular   Manufacturer: Moderna   Lot: [id number]   NDC: 50871-994-12

## 2019-09-04 ENCOUNTER — Ambulatory Visit: Payer: Self-pay | Attending: Family

## 2019-09-04 DIAGNOSIS — Z23 Encounter for immunization: Secondary | ICD-10-CM

## 2019-09-04 NOTE — Progress Notes (Signed)
   Covid-19 Vaccination Clinic  Name:  Tammy Huang    MRN: [id number] DOB: [birth year]  09/04/2019  Tammy Huang was observed post Covid-19 immunization for 15 minutes without incident. She was provided with Vaccine Information Sheet and instruction to access the V-Safe system.   Tammy Huang was instructed to call 911 with any severe reactions post vaccine: Marland Kitchen Difficulty breathing  . Swelling of face and throat  . A fast heartbeat  . A bad rash all over body  . Dizziness and weakness   Immunizations Administered    Name Date Dose VIS Date Route   Moderna COVID-19 Vaccine 09/04/2019  5:16 PM 0.5 mL 05/01/2019 Intramuscular   Manufacturer: Moderna   Lot: [id number]   NDC: 99234-144-36

## 2021-04-28 ENCOUNTER — Other Ambulatory Visit: Payer: Self-pay

## 2021-04-28 ENCOUNTER — Emergency Department (HOSPITAL_BASED_OUTPATIENT_CLINIC_OR_DEPARTMENT_OTHER)
Admission: EM | Admit: 2021-04-28 | Discharge: 2021-04-28 | Disposition: A | Discharge status: Home / Self Care | Payer: Self-pay | Attending: Emergency Medicine | Admitting: Emergency Medicine

## 2021-04-28 ENCOUNTER — Encounter (HOSPITAL_BASED_OUTPATIENT_CLINIC_OR_DEPARTMENT_OTHER): Payer: Self-pay | Admitting: Emergency Medicine

## 2021-04-28 DIAGNOSIS — J101 Influenza due to other identified influenza virus with other respiratory manifestations: Secondary | ICD-10-CM | POA: Insufficient documentation

## 2021-04-28 DIAGNOSIS — Z79899 Other long term (current) drug therapy: Secondary | ICD-10-CM | POA: Insufficient documentation

## 2021-04-28 DIAGNOSIS — Z20822 Contact with and (suspected) exposure to covid-19: Secondary | ICD-10-CM | POA: Insufficient documentation

## 2021-04-28 DIAGNOSIS — I10 Essential (primary) hypertension: Secondary | ICD-10-CM | POA: Insufficient documentation

## 2021-04-28 DIAGNOSIS — J45909 Unspecified asthma, uncomplicated: Secondary | ICD-10-CM | POA: Insufficient documentation

## 2021-04-28 LAB — RESP PANEL BY RT-PCR (FLU A&B, COVID) ARPGX2
Influenza A by PCR: POSITIVE — AB
Influenza B by PCR: NEGATIVE
SARS Coronavirus 2 by RT PCR: NEGATIVE

## 2021-04-28 MED ORDER — ALBUTEROL SULFATE HFA 108 (90 BASE) MCG/ACT IN AERS: 1.0000 | INHALATION_SPRAY | Freq: Four times a day (QID) | RESPIRATORY_TRACT | 0 refills | Status: DC | PRN

## 2021-04-28 MED ORDER — BENZONATATE 100 MG PO CAPS: 100.0000 mg | ORAL_CAPSULE | Freq: Three times a day (TID) | ORAL | 0 refills | Status: DC

## 2021-04-28 MED ORDER — ACETAMINOPHEN 325 MG PO TABS
650.0000 mg | ORAL_TABLET | Freq: Once | ORAL | Status: AC
  Administered 2021-04-28: 650 mg via ORAL

## 2021-04-28 NOTE — ED Triage Notes (Signed)
Pt arrives to ED with c/o of generalized body aches. This started on 11/25. Associated symptoms include fever, chills, sore throat, chest congestion. Pt reports family members tested positive for flu.

## 2021-04-28 NOTE — ED Provider Notes (Signed)
[facility] EMERGENCY DEPT Provider Note   CSN: [id number] Arrival date & time: 04/28/21  1056     History Chief Complaint  Patient presents with   Generalized Body Aches    Tammy Huang is a 55 y.o. female with a remote history of asthma who presents the emergency department with flulike symptoms that began 5 days ago.  Patient states that she had family members tested positive for influenza prior to her symptom onset.  Patient complains of general malaise, myalgias, subjective fever, productive cough with yellowish sputum, headache.  She denies any abdominal pain, nausea, vomiting, diarrhea.  No urinary complaints.  No chest pain or shortness of breath.  HPI     Past Medical History:  Diagnosis Date   Anemia    Hypertension     There are no problems to display for this patient.   Past Surgical History:  Procedure Laterality Date   NO PAST SURGERIES       OB History   No obstetric history on file.     Family History  Problem Relation Age of Onset   Heart disease Father    Diabetes Maternal Aunt    Esophageal cancer Maternal Grandfather    Colon cancer Neg Hx     Social History   Tobacco Use   Smoking status: Never   Smokeless tobacco: Never  Substance Use Topics   Alcohol use: No   Drug use: No    Home Medications Prior to Admission medications   Medication Sig Start Date End Date Taking? Authorizing Provider  albuterol (VENTOLIN HFA) 108 (90 Base) MCG/ACT inhaler Inhale 1-2 puffs into the lungs every 6 (six) hours as needed for wheezing or shortness of breath. 04/28/21  Yes Odile Veloso M, PA-C  benzonatate (TESSALON) 100 MG capsule Take 1 capsule (100 mg total) by mouth every 8 (eight) hours. 04/28/21  Yes Meredeth Ide, Byrant Valent M, PA-C  docusate sodium (COLACE) 100 MG capsule Take 100 mg by mouth 2 (two) times daily.    [provider]  ferrous gluconate (FERGON) 324 MG tablet Take 324 mg by mouth 2 (two) times daily with a meal.     [provider]  ibuprofen (ADVIL,MOTRIN) 200 MG tablet Take 400 mg by mouth every 6 (six) hours as needed.    [provider]  LOSARTAN POTASSIUM PO Take 12.5 mg by mouth.    [provider]  Pseudoeph-Doxylamine-DM-APAP (NYQUIL PO) Take 30 mLs by mouth at bedtime.    [provider]  Pseudoephedrine-APAP-DM (DAYQUIL PO) Take 2 capsules by mouth every 6 (six) hours as needed (cough).    [provider]    Allergies    Patient has no known allergies.  Review of Systems   Review of Systems  All other systems reviewed and are negative.  Physical Exam Updated Vital Signs BP (!) 143/86 (BP Location: Right Arm)   Pulse 92   Temp (!) 100.5 F (38.1 C) (Oral)   Resp 17   Ht 5\' 2"  (1.575 m)   Wt 118.8 kg   LMP 12/22/2016   SpO2 98%   BMI 47.92 kg/m   Physical Exam Vitals and nursing note reviewed.  Constitutional:      General: She is not in acute distress.    Appearance: Normal appearance.  HENT:     Head: Normocephalic and atraumatic.  Eyes:     General:        Right eye: No discharge.  Left eye: No discharge.  Cardiovascular:     Comments: Regular rate and rhythm.  S1/S2 are distinct without any evidence of murmur, rubs, or gallops.  Radial pulses are 2+ bilaterally.  Dorsalis pedis pulses are 2+ bilaterally.  No evidence of pedal edema. Pulmonary:     Comments: Normal effort.  No respiratory distress.  There is mild wheeze heard throughout the lung fields.  No evidence of overlying rhonchi or rales. Musculoskeletal:        General: Normal range of motion.     Cervical back: Neck supple.  Skin:    General: Skin is warm and dry.     Findings: No rash.  Neurological:     General: No focal deficit present.     Mental Status: She is alert.  Psychiatric:        Mood and Affect: Mood normal.        Behavior: Behavior normal.    ED Results / Procedures / Treatments   Labs (all labs ordered are listed, but only  abnormal results are displayed) Labs Reviewed  RESP PANEL BY RT-PCR (FLU A&B, COVID) ARPGX2 - Abnormal; Notable for the following components:      Result Value   Influenza A by PCR POSITIVE (*)    All other components within normal limits    EKG None  Radiology No results found.  Procedures Procedures   Medications Ordered in ED Medications  acetaminophen (TYLENOL) tablet 650 mg (has no administration in time range)    ED Course  I have reviewed the triage vital signs and the nursing notes.  Pertinent labs & imaging results that were available during my care of the patient were reviewed by me and considered in my medical decision making (see chart for details).    MDM Rules/Calculators/A&P                          Tammy Huang is a 55 y.o. female who presents the emergency department for for evaluation of flulike symptoms.  Patient tested positive for influenza.  This is likely the source of her symptoms at this time.  650 mg Tylenol given for fever in the emergency department today.  She is in no respiratory distress at this time.  I also have a low suspicion for overlying pneumonia. I discussed at length over-the-counter treatments for her symptoms.  I will also prescribe her albuterol inhaler and Tessalon Perles.  Patient is outside the window for Tamiflu. Strict return precautions given.  She is safe for discharge.   Final Clinical Impression(s) / ED Diagnoses Final diagnoses:  Influenza A    Rx / DC Orders ED Discharge Orders          Ordered    benzonatate (TESSALON) 100 MG capsule  Every 8 hours        04/28/21 1333    albuterol (VENTOLIN HFA) 108 (90 Base) MCG/ACT inhaler  Every 6 hours PRN        04/28/21 1333             Honor Loh Lund, [place] 04/28/21 1334    Gloris Manchester, MD 04/29/21 1031

## 2021-04-28 NOTE — Discharge Instructions (Signed)
You tested positive for flu.  Please alternate Tylenol and ibuprofen every 3-4 hours for fever and aches.  You can also take DayQuil and NyQuil which has Tylenol in it.  I have given you albuterol inhaler for wheezing and Tessalon Perles for cough.    Please return to the emergency department if you experience worsening cough, fever that will go down with Tylenol or ibuprofen, intractable vomiting or diarrhea, or any other concerns you may have.

## 2023-08-11 LAB — LAB REPORT - SCANNED: A1c: 5.6

## 2023-08-25 ENCOUNTER — Encounter: Payer: Self-pay | Admitting: Family Medicine

## 2023-08-25 ENCOUNTER — Ambulatory Visit: Admitting: Family Medicine

## 2023-08-25 VITALS — BP 136/118 | HR 95 | Temp 98.5°F | Ht 62.0 in | Wt 254.1 lb

## 2023-08-25 DIAGNOSIS — N1832 Chronic kidney disease, stage 3b: Secondary | ICD-10-CM | POA: Diagnosis not present

## 2023-08-25 DIAGNOSIS — I10 Essential (primary) hypertension: Secondary | ICD-10-CM | POA: Diagnosis not present

## 2023-08-25 DIAGNOSIS — Z23 Encounter for immunization: Secondary | ICD-10-CM

## 2023-08-25 DIAGNOSIS — Z1159 Encounter for screening for other viral diseases: Secondary | ICD-10-CM

## 2023-08-25 DIAGNOSIS — D508 Other iron deficiency anemias: Secondary | ICD-10-CM

## 2023-08-25 DIAGNOSIS — Z206 Contact with and (suspected) exposure to human immunodeficiency virus [HIV]: Secondary | ICD-10-CM | POA: Diagnosis not present

## 2023-08-25 DIAGNOSIS — Z1231 Encounter for screening mammogram for malignant neoplasm of breast: Secondary | ICD-10-CM

## 2023-08-25 DIAGNOSIS — Z01419 Encounter for gynecological examination (general) (routine) without abnormal findings: Secondary | ICD-10-CM

## 2023-08-25 DIAGNOSIS — N1831 Chronic kidney disease, stage 3a: Secondary | ICD-10-CM | POA: Insufficient documentation

## 2023-08-25 DIAGNOSIS — D509 Iron deficiency anemia, unspecified: Secondary | ICD-10-CM | POA: Insufficient documentation

## 2023-08-25 MED ORDER — LOSARTAN POTASSIUM 50 MG PO TABS: 50.0000 mg | ORAL_TABLET | Freq: Every day | ORAL | 1 refills | Status: DC

## 2023-08-25 NOTE — Progress Notes (Signed)
 Patient Office Visit  Assessment & Plan:  Hypertension, unspecified type -     COMPLETE METABOLIC PANEL WITHOUT GFR -     Hemoglobin A1c -     Lipid panel -     CBC with Differential/Platelet -     TSH -     VITAMIN D 25 Hydroxy (Vit-D Deficiency, Fractures) -     Losartan Potassium; Take 1 tablet (50 mg total) by mouth daily.  Dispense: 90 tablet; Refill: 1  CKD stage 3b, GFR 30-44 ml/min (HCC) -     COMPLETE METABOLIC PANEL WITHOUT GFR  Need for Tdap vaccination -     Tdap vaccine greater than or equal to 7yo IM  Need for hepatitis C screening test -     Hepatitis C antibody; Future  HIV exposure -     HIV Antibody (routine testing w rflx); Future  Visit for screening mammogram -     3D Screening Mammogram, Left and Right; Future  Other iron deficiency anemia -     Iron, TIBC and Ferritin Panel  Encounter for gynecological examination with Papanicolaou smear of cervix -     Ambulatory referral to Obstetrics / Gynecology   Restart losartan 50 mg once a day.  Patient will monitor blood pressures at home.  Patient will continue to avoid NSAIDs and Goody powders.  Follow-up on lab work and notify patient.  Recommend starting gradual exercise program..  3D mammogram ordered, tetanus updated today and #1 Shingrix given today. OB-GYN referral ordered for patient. Return in 4 weeks or sooner if necessary. Return in about 4 weeks (around 09/22/2023), or if symptoms worsen or fail to improve.   Subjective:    Patient ID: Tammy Huang, female    DOB: 02-02-1966  Age: 58 y.o. MRN: [id number]  Chief Complaint  Patient presents with   Establish Care    Here to establish care. Pt c/o of diarrhea after eating red meat.   Medical Management of Chronic Issues    HPI Establish primary care and discuss chronic medical issues.  Has Not seen primary care physician in several years.  Patient did not have insurance until now. HTN-Not using antihypertensive medication without  difficulty.  Denies associated signs and symptoms including chest pain, shortness of breath, cough headache, peripheral swelling cramps spasms and palpitations.  Voices understanding of the potential for interference with blood pressure control with substances including high sodium intake, decongestions, herbal supplements weight loss supplements nutritional supplements.  Pressures have been elevated the last few weeks.  Patient used to take losartan 50 mg but ran out of the medications and she did not have insurance.  Patient's had elevated blood pressures recently and is concerned. CKD Stage 4 - had labs done in January which showed GFR 44 range. pt has been taking a lot of NSAID but stopped taking Advil about 2 weeks ago. Was also taking goody powders but not now Obesity- pt has been trying to eat healthier and knows she needs to start an exercise program.  Patient enjoys walking and will start doing that.  Patient is motivated to make positive lifestyle changes. Health Maintenance- needs mammogram, needs Tdap, needs Shingrix Patient would like to be tested for hepatitis C and HIV.  Patient has never done this.  Patient does have an up-to-date colonoscopy. Needs pap smear-has been over 10 years ago.   The ASCVD Risk score (Arnett DK, et al., 2019) failed to calculate for the following reasons:   Cannot find a  previous HDL lab   Cannot find a previous total cholesterol lab  Past Medical History:  Diagnosis Date   Anemia    Arthritis    Asthma    Chronic kidney disease 07/12/2023   Hypertension    Past Surgical History:  Procedure Laterality Date   NO PAST SURGERIES     TUBAL LIGATION  03/21/2001   Social History   Tobacco Use   Smoking status: Never   Smokeless tobacco: Never  Substance Use Topics   Alcohol use: Yes    Alcohol/week: 3.0 standard drinks of alcohol    Types: 3 Glasses of wine per week   Drug use: No   Family History  Problem Relation Age of Onset   Epilepsy Mother     Heart disease Father    Hypertension Father    Hypertension Sister    Esophageal cancer Maternal Grandfather    Heart disease Paternal Uncle    Colon cancer Neg Hx    Breast cancer Neg Hx    Uterine cancer Neg Hx    Ovarian cancer Neg Hx    No Known Allergies  ROS    Objective:    BP (!) 136/118   Pulse 95   Temp 98.5 F (36.9 C)   Ht 5\' 2"  (1.575 m)   Wt 254 lb 2 oz (115.3 kg)   LMP 12/22/2016   SpO2 94%   BMI 46.48 kg/m  BP Readings from Last 3 Encounters:  08/25/23 (!) 136/118  04/28/21 (!) 143/86  02/17/16 136/88   Wt Readings from Last 3 Encounters:  08/25/23 254 lb 2 oz (115.3 kg)  04/28/21 262 lb (118.8 kg)  02/17/16 230 lb (104.3 kg)    Physical Exam Vitals and nursing note reviewed.  Constitutional:      Appearance: Normal appearance.  HENT:     Head: Normocephalic.     Right Ear: Tympanic membrane, ear canal and external ear normal.     Left Ear: Tympanic membrane, ear canal and external ear normal.  Eyes:     Extraocular Movements: Extraocular movements intact.     Conjunctiva/sclera: Conjunctivae normal.     Pupils: Pupils are equal, round, and reactive to light.  Cardiovascular:     Rate and Rhythm: Normal rate and regular rhythm.     Heart sounds: Normal heart sounds.  Pulmonary:     Effort: Pulmonary effort is normal.     Breath sounds: Normal breath sounds.  Musculoskeletal:     Right lower leg: No edema.     Left lower leg: No edema.  Neurological:     General: No focal deficit present.     Mental Status: She is alert and oriented to person, place, and time.  Psychiatric:        Mood and Affect: Mood normal.        Behavior: Behavior normal.        Thought Content: Thought content normal.      No results found for any visits on 08/25/23.

## 2023-08-26 ENCOUNTER — Encounter: Payer: Self-pay | Admitting: Family Medicine

## 2023-08-26 LAB — COMPLETE METABOLIC PANEL WITHOUT GFR
AG Ratio: 1.3 (calc) (ref 1.0–2.5)
ALT: 14 U/L (ref 6–29)
AST: 16 U/L (ref 10–35)
Albumin: 4.2 g/dL (ref 3.6–5.1)
Alkaline phosphatase (APISO): 106 U/L (ref 37–153)
BUN/Creatinine Ratio: 17 (calc) (ref 6–22)
BUN: 18 mg/dL (ref 7–25)
CO2: 25 mmol/L (ref 20–32)
Calcium: 9.1 mg/dL (ref 8.6–10.4)
Chloride: 105 mmol/L (ref 98–110)
Creat: 1.04 mg/dL — ABNORMAL HIGH (ref 0.50–1.03)
Globulin: 3.2 g/dL (ref 1.9–3.7)
Glucose, Bld: 99 mg/dL (ref 65–99)
Potassium: 4 mmol/L (ref 3.5–5.3)
Sodium: 140 mmol/L (ref 135–146)
Total Bilirubin: 0.5 mg/dL (ref 0.2–1.2)
Total Protein: 7.4 g/dL (ref 6.1–8.1)

## 2023-08-26 LAB — CBC WITH DIFFERENTIAL/PLATELET
Absolute Lymphocytes: 1465 {cells}/uL (ref 850–3900)
Absolute Monocytes: 270 {cells}/uL (ref 200–950)
Basophils Absolute: 60 {cells}/uL (ref 0–200)
Basophils Relative: 1.2 %
Eosinophils Absolute: 140 {cells}/uL (ref 15–500)
Eosinophils Relative: 2.8 %
HCT: 37.2 % (ref 35.0–45.0)
Hemoglobin: 11.8 g/dL (ref 11.7–15.5)
MCH: 27.9 pg (ref 27.0–33.0)
MCHC: 31.7 g/dL — ABNORMAL LOW (ref 32.0–36.0)
MCV: 87.9 fL (ref 80.0–100.0)
MPV: 11.4 fL (ref 7.5–12.5)
Monocytes Relative: 5.4 %
Neutro Abs: 3065 {cells}/uL (ref 1500–7800)
Neutrophils Relative %: 61.3 %
Platelets: 231 10*3/uL (ref 140–400)
RBC: 4.23 10*6/uL (ref 3.80–5.10)
RDW: 12.6 % (ref 11.0–15.0)
Total Lymphocyte: 29.3 %
WBC: 5 10*3/uL (ref 3.8–10.8)

## 2023-08-26 LAB — LIPID PANEL
Cholesterol: 177 mg/dL (ref <200)
HDL: 65 mg/dL (ref >=50)
LDL Cholesterol (Calc): 95 mg/dL
Non-HDL Cholesterol (Calc): 112 mg/dL (ref <130)
Total CHOL/HDL Ratio: 2.7 (calc) (ref <5.0)
Triglycerides: 76 mg/dL (ref <150)

## 2023-08-26 LAB — HEMOGLOBIN A1C
Hgb A1c MFr Bld: 5.6 %{Hb} (ref <5.7)
Mean Plasma Glucose: 114 mg/dL
eAG (mmol/L): 6.3 mmol/L

## 2023-08-26 LAB — IRON,TIBC AND FERRITIN PANEL
%SAT: 18 % (ref 16–45)
Ferritin: 36 ng/mL (ref 16–232)
Iron: 54 ug/dL (ref 45–160)
TIBC: 302 ug/dL (ref 250–450)

## 2023-08-26 LAB — TSH: TSH: 1.5 m[IU]/L (ref 0.40–4.50)

## 2023-08-26 LAB — VITAMIN D 25 HYDROXY (VIT D DEFICIENCY, FRACTURES): Vit D, 25-Hydroxy: 36 ng/mL (ref 30–100)

## 2023-08-28 ENCOUNTER — Encounter: Payer: Self-pay | Admitting: Family Medicine

## 2023-08-30 ENCOUNTER — Ambulatory Visit
Admission: RE | Admit: 2023-08-30 | Discharge: 2023-08-30 | Disposition: A | Discharge status: Home / Self Care | Source: Ambulatory Visit | Attending: Family Medicine | Admitting: Family Medicine

## 2023-08-30 DIAGNOSIS — Z1231 Encounter for screening mammogram for malignant neoplasm of breast: Secondary | ICD-10-CM

## 2023-09-13 ENCOUNTER — Encounter: Payer: Self-pay | Admitting: Family Medicine

## 2023-09-20 ENCOUNTER — Encounter: Payer: Self-pay | Admitting: Obstetrics & Gynecology

## 2023-09-20 ENCOUNTER — Other Ambulatory Visit (HOSPITAL_COMMUNITY)
Admission: RE | Admit: 2023-09-20 | Discharge: 2023-09-20 | Disposition: A | Discharge status: Home / Self Care | Source: Ambulatory Visit | Attending: Obstetrics & Gynecology | Admitting: Obstetrics & Gynecology

## 2023-09-20 ENCOUNTER — Ambulatory Visit: Admitting: Obstetrics & Gynecology

## 2023-09-20 VITALS — BP 153/98 | HR 87 | Ht 62.0 in | Wt 251.0 lb

## 2023-09-20 DIAGNOSIS — Z01419 Encounter for gynecological examination (general) (routine) without abnormal findings: Secondary | ICD-10-CM

## 2023-09-20 DIAGNOSIS — I10 Essential (primary) hypertension: Secondary | ICD-10-CM

## 2023-09-20 NOTE — Progress Notes (Signed)
 GYNECOLOGY CLINIC ANNUAL PREVENTATIVE CARE ENCOUNTER NOTE  Subjective:   Tammy Huang is a 58 y.o. [id number] female here for a routine annual gynecologic exam.  Current complaints: postmenopausal, needs routine gyn exam, had mammogram last month.   Denies vaginal bleeding, discharge, pelvic pain, problems with intercourse or other gynecologic concerns.    Gynecologic History Patient's last menstrual period was 12/22/2016. Contraception: post menopausal status Last Pap: ?Aaron Aas Results were: normal Last mammogram: 1 mo. Results were: normal  Obstetric History OB History  Gravida Para Term Preterm AB Living  3 3 3   3   SAB IAB Ectopic Multiple Live Births      3    # Outcome Date GA Lbr Len/2nd Weight Sex Type Anes PTL Lv  3 Term 03/11/00    F Vag-Spont   LIV  2 Term 11/09/97    M Vag-Spont   LIV  1 Term 08/03/88    Darroll Emory   LIV    Past Medical History:  Diagnosis Date   Anemia    Arthritis    Asthma    Chronic kidney disease 07/12/2023   Hypertension     Past Surgical History:  Procedure Laterality Date   NO PAST SURGERIES     TUBAL LIGATION  03/21/2001    Current Outpatient Medications on File Prior to Visit  Medication Sig Dispense Refill   ferrous sulfate 325 (65 FE) MG EC tablet Take 325 mg by mouth 3 (three) times daily with meals.     losartan  (COZAAR ) 50 MG tablet Take 1 tablet (50 mg total) by mouth daily. 90 tablet 1   Current Facility-Administered Medications on File Prior to Visit  Medication Dose Route Frequency Provider Last Rate Last Admin   0.9 %  sodium chloride  infusion  500 mL Intravenous Continuous Asencion Blacksmith, MD        No Known Allergies  Social History   Socioeconomic History   Marital status: Widowed    Spouse name: Not on file   Number of children: Not on file   Years of education: Not on file   Highest education level: 12th grade  Occupational History   Not on file  Tobacco Use   Smoking status: Never   Smokeless tobacco: Never   Substance and Sexual Activity   Alcohol use: Not Currently    Alcohol/week: 3.0 standard drinks of alcohol    Types: 3 Glasses of wine per week   Drug use: No   Sexual activity: Yes    Birth control/protection: Surgical  Other Topics Concern   Not on file  Social History Narrative   Not on file   Social Drivers of Health   Financial Resource Strain: Medium Risk (08/21/2023)   Overall Financial Resource Strain (CARDIA)    Difficulty of Paying Living Expenses: Somewhat hard  Food Insecurity: Food Insecurity Present (08/21/2023)   Hunger Vital Sign    Worried About Running Out of Food in the Last Year: Sometimes true    Ran Out of Food in the Last Year: Sometimes true  Transportation Needs: No Transportation Needs (08/21/2023)   PRAPARE - Administrator, Civil Service (Medical): No    Lack of Transportation (Non-Medical): No  Physical Activity: Unknown (08/21/2023)   Exercise Vital Sign    Days of Exercise per Week: 0 days    Minutes of Exercise per Session: Not on file  Stress: No Stress Concern Present (08/21/2023)   [employer] of Occupational Health - Occupational Stress  Questionnaire    Feeling of Stress : Only a little  Social Connections: Moderately Isolated (08/21/2023)   Social Connection and Isolation Panel [NHANES]    Frequency of Communication with Friends and Family: Three times a week    Frequency of Social Gatherings with Friends and Family: Twice a week    Attends Religious Services: 1 to 4 times per year    Active Member of [employer] or Organizations: No    Attends Banker Meetings: Not on file    Marital Status: Widowed  Catering manager Violence: Not on file    Family History  Problem Relation Age of Onset   Epilepsy Mother    Heart disease Father    Hypertension Father    Hypertension Sister    Esophageal cancer Maternal Grandfather    Heart disease Paternal Uncle    Colon cancer Neg Hx    Breast cancer Neg Hx    Uterine  cancer Neg Hx    Ovarian cancer Neg Hx     The following portions of the patient's history were reviewed and updated as appropriate: allergies, current medications, past family history, past medical history, past social history, past surgical history and problem list.  Review of Systems Respiratory: negative Cardiovascular: negative Gastrointestinal: negative Genitourinary:negative   Objective:  BP (!) 153/98   Pulse 87   Ht 5\' 2"  (1.575 m)   Wt 251 lb (113.9 kg)   LMP 12/22/2016   BMI 45.91 kg/m  CONSTITUTIONAL: Well-developed, obese female in no acute distress.  HENT:  Normocephalic, atraumatic, External right and left ear normal. Oropharynx is clear and moist EYES: Conjunctivae and EOM are normal. Pupils are equal, round, and reactive to light. No scleral icterus.  NECK: Normal range of motion, supple, no masses.  Normal thyroid.  SKIN: Skin is warm and dry. No rash noted. Not diaphoretic. No erythema. No pallor. NEUROLGIC: Alert and oriented to person, place, and time. Normal reflexes, muscle tone coordination. No cranial nerve deficit noted. PSYCHIATRIC: Normal mood and affect. Normal behavior. Normal judgment and thought content. CARDIOVASCULAR: Normal heart rate noted, regular rhythm RESPIRATORY: Effort and breath sounds normal, no problems with respiration noted. BREASTS: Symmetric in size. No masses, skin changes, nipple drainage, or lymphadenopathy. ABDOMEN: Soft, normal bowel sounds, no distention noted.  No tenderness, rebound or guarding.  PELVIC: Normal appearing external genitalia; normal appearing vaginal mucosa and cervix.  No abnormal discharge noted.  Pap smear obtained.  Normal uterine size, no other palpable masses, no uterine or adnexal tenderness. MUSCULOSKELETAL: Normal range of motion. No tenderness.  No cyanosis, clubbing, or edema.  CBC    Component Value Date/Time   WBC 5.0 08/25/2023 0907   RBC 4.23 08/25/2023 0907   HGB 11.8 08/25/2023 0907   HCT  37.2 08/25/2023 0907   PLT 231 08/25/2023 0907   MCV 87.9 08/25/2023 0907   MCH 27.9 08/25/2023 0907   MCHC 31.7 (L) 08/25/2023 0907   RDW 12.6 08/25/2023 0907   LYMPHSABS 1.8 06/18/2013 1438   MONOABS 0.4 06/18/2013 1438   EOSABS 140 08/25/2023 0907   BASOSABS 60 08/25/2023 0907      Latest Ref Rng & Units 08/25/2023    9:07 AM 06/18/2013    3:31 PM  CMP  Glucose 65 - 99 mg/dL 99  81   BUN 7 - 25 mg/dL 18  9   Creatinine 1.61 - 1.03 mg/dL 0.96  0.45   Sodium 409 - 146 mmol/L 140  139   Potassium 3.5 -  5.3 mmol/L 4.0  4.1   Chloride 98 - 110 mmol/L 105  102   CO2 20 - 32 mmol/L 25    Calcium 8.6 - 10.4 mg/dL 9.1    Total Protein 6.1 - 8.1 g/dL 7.4    Total Bilirubin 0.2 - 1.2 mg/dL 0.5    AST 10 - 35 U/L 16    ALT 6 - 29 U/L 14         Assessment:  Annual gynecologic examination with pap smear  HTN under care of a physician  Plan:  Will follow up results of pap smear and manage accordingly. Routine preventative health maintenance measures emphasized. Please refer to After Visit Summary for other counseling recommendations.  Recommend screening colonoscopy  Onnie Bilis, MD Attending Obstetrician & Gynecologist Center for [facility], [facility]

## 2023-09-20 NOTE — Progress Notes (Signed)
 Pt is new to office here for routine GYN exam. Pt states last exam several years ago. Last mammo 09/01/23.  Pt has elevated BP in office today, just recently started on medication by PCP.

## 2023-09-22 LAB — CYTOLOGY - PAP
Comment: NEGATIVE
Diagnosis: NEGATIVE
High risk HPV: NEGATIVE

## 2023-09-26 ENCOUNTER — Ambulatory Visit: Admitting: Family Medicine

## 2023-09-26 ENCOUNTER — Encounter: Payer: Self-pay | Admitting: Family Medicine

## 2023-09-26 VITALS — BP 140/110 | HR 81 | Temp 98.0°F | Ht 62.0 in | Wt 251.1 lb

## 2023-09-26 DIAGNOSIS — N1831 Chronic kidney disease, stage 3a: Secondary | ICD-10-CM | POA: Diagnosis not present

## 2023-09-26 DIAGNOSIS — Z23 Encounter for immunization: Secondary | ICD-10-CM

## 2023-09-26 DIAGNOSIS — I10 Essential (primary) hypertension: Secondary | ICD-10-CM

## 2023-09-26 MED ORDER — LOSARTAN POTASSIUM 50 MG PO TABS: ORAL_TABLET | ORAL | 1 refills | Status: DC

## 2023-09-26 NOTE — Progress Notes (Signed)
 Patient Office Visit  Assessment & Plan:  Hypertension, unspecified type -     Losartan  Potassium; 1.5 tablets per day  Dispense: 135 tablet; Refill: 1  Need for shingles vaccine  CKD stage 3a, GFR 45-59 ml/min (HCC) -     Comprehensive metabolic panel with GFR   Increase losartan  to 75 mg once a day.  If blood pressures stay borderline elevated we may need to increase to 100 mg once a day.  Patient can notify us  after 2 weeks how her blood pressures have been doing at home.  Return in 4 to 6 weeks or sooner if necessary.  Follow up on lab work and notify patient.  Shingrix she will have to get at the pharmacy. No follow-ups on file.   Subjective:    Patient ID: Tammy Huang, female    DOB: 07/08/1965  Age: 58 y.o. MRN: [id number]  No chief complaint on file.   HPI HTN-using antihypertensive medication without difficulty.  Denies associated signs and symptoms including chest pain, shortness of breath, cough headache, peripheral swelling cramps spasms and palpitations.  Voices understanding of the potential for interference with blood pressure control with substances including high sodium intake, decongestions, herbal supplements weight loss supplements nutritional supplements.  Blood pressures have been fluctuating the past few weeks, overall improved since being on Losartan . Pt is eating healthier and started gradual exercise. Pt drinking more water.  Elevated BMI 45- pt has been trying to make positive lifestyle changes.  Previous hx of iron deficiency- iron panel has improved.  CKD Stage 3A- patient does not take NSAIDs. We will repeat lab today to see where we are.  Health maintenance-patient does have up-to-date tetanus shot.  Up-to-date mammogram and up-to-date Pap smear.  Colonoscopy is also up-to-date.  Patient needs a Shingrix vaccine but this will have to be done at the pharmacy since it is not covered in the office. The 10-year ASCVD risk score (Arnett DK, et al., 2019) is:  5.9%  Past Medical History:  Diagnosis Date   Anemia    Arthritis    Asthma    Chronic kidney disease 07/12/2023   Hypertension    Past Surgical History:  Procedure Laterality Date   NO PAST SURGERIES     TUBAL LIGATION  03/21/2001   Social History   Tobacco Use   Smoking status: Never   Smokeless tobacco: Never  Substance Use Topics   Alcohol use: Not Currently    Alcohol/week: 3.0 standard drinks of alcohol    Types: 3 Glasses of wine per week   Drug use: No   Family History  Problem Relation Age of Onset   Epilepsy Mother    Heart disease Father    Hypertension Father    Hypertension Sister    Esophageal cancer Maternal Grandfather    Heart disease Paternal Uncle    Colon cancer Neg Hx    Breast cancer Neg Hx    Uterine cancer Neg Hx    Ovarian cancer Neg Hx    No Known Allergies  ROS    Objective:    BP (!) 140/110   Pulse 81   Temp 98 F (36.7 C)   Ht 5\' 2"  (1.575 m)   Wt 251 lb 2 oz (113.9 kg)   LMP 12/22/2016   SpO2 99%   BMI 45.93 kg/m  BP Readings from Last 3 Encounters:  09/26/23 (!) 140/110  09/20/23 (!) 153/98  08/25/23 (!) 136/118   Wt Readings from Last 3  Encounters:  09/26/23 251 lb 2 oz (113.9 kg)  09/20/23 251 lb (113.9 kg)  08/25/23 254 lb 2 oz (115.3 kg)    Physical Exam Vitals and nursing note reviewed.  Constitutional:      Appearance: Normal appearance.  HENT:     Head: Normocephalic.     Right Ear: Tympanic membrane, ear canal and external ear normal.     Left Ear: Tympanic membrane, ear canal and external ear normal.  Eyes:     Extraocular Movements: Extraocular movements intact.     Conjunctiva/sclera: Conjunctivae normal.     Pupils: Pupils are equal, round, and reactive to light.  Cardiovascular:     Rate and Rhythm: Normal rate and regular rhythm.     Heart sounds: Normal heart sounds.  Pulmonary:     Effort: Pulmonary effort is normal.     Breath sounds: Normal breath sounds.  Musculoskeletal:     Right  lower leg: No edema.     Left lower leg: No edema.  Neurological:     General: No focal deficit present.     Mental Status: She is alert and oriented to person, place, and time.  Psychiatric:        Mood and Affect: Mood normal.        Behavior: Behavior normal.        Thought Content: Thought content normal.        Judgment: Judgment normal.      No results found for any visits on 09/26/23.

## 2023-09-27 ENCOUNTER — Encounter: Payer: Self-pay | Admitting: Family Medicine

## 2023-09-27 LAB — COMPREHENSIVE METABOLIC PANEL WITH GFR
AG Ratio: 1.4 (calc) (ref 1.0–2.5)
ALT: 17 U/L (ref 6–29)
AST: 19 U/L (ref 10–35)
Albumin: 4.3 g/dL (ref 3.6–5.1)
Alkaline phosphatase (APISO): 99 U/L (ref 37–153)
BUN/Creatinine Ratio: 12 (calc) (ref 6–22)
BUN: 14 mg/dL (ref 7–25)
CO2: 28 mmol/L (ref 20–32)
Calcium: 9.4 mg/dL (ref 8.6–10.4)
Chloride: 104 mmol/L (ref 98–110)
Creat: 1.2 mg/dL — ABNORMAL HIGH (ref 0.50–1.03)
Globulin: 3 g/dL (ref 1.9–3.7)
Glucose, Bld: 92 mg/dL (ref 65–99)
Potassium: 4.5 mmol/L (ref 3.5–5.3)
Sodium: 142 mmol/L (ref 135–146)
Total Bilirubin: 0.4 mg/dL (ref 0.2–1.2)
Total Protein: 7.3 g/dL (ref 6.1–8.1)
eGFR: 53 mL/min/{1.73_m2} — ABNORMAL LOW (ref >=60)

## 2023-09-30 ENCOUNTER — Encounter: Payer: Self-pay | Admitting: Family Medicine

## 2023-10-04 ENCOUNTER — Other Ambulatory Visit: Payer: Self-pay

## 2023-10-04 MED ORDER — SEMAGLUTIDE(0.25 OR 0.5MG/DOS) 2 MG/3ML ~~LOC~~ SOPN: 0.2500 mg | PEN_INJECTOR | SUBCUTANEOUS | 1 refills | Status: DC

## 2023-10-10 ENCOUNTER — Other Ambulatory Visit: Payer: Self-pay

## 2023-10-10 ENCOUNTER — Encounter: Payer: Self-pay | Admitting: Family Medicine

## 2023-10-10 DIAGNOSIS — N1831 Chronic kidney disease, stage 3a: Secondary | ICD-10-CM

## 2023-10-14 ENCOUNTER — Other Ambulatory Visit: Payer: Self-pay

## 2023-10-31 ENCOUNTER — Encounter: Payer: Self-pay | Admitting: Family Medicine

## 2023-10-31 ENCOUNTER — Encounter (INDEPENDENT_AMBULATORY_CARE_PROVIDER_SITE_OTHER): Payer: Self-pay

## 2023-10-31 ENCOUNTER — Ambulatory Visit: Admitting: Family Medicine

## 2023-10-31 VITALS — BP 140/110 | HR 82 | Temp 98.5°F | Ht 62.0 in | Wt 251.0 lb

## 2023-10-31 DIAGNOSIS — N1831 Chronic kidney disease, stage 3a: Secondary | ICD-10-CM | POA: Diagnosis not present

## 2023-10-31 DIAGNOSIS — I10 Essential (primary) hypertension: Secondary | ICD-10-CM | POA: Diagnosis not present

## 2023-10-31 DIAGNOSIS — Z6841 Body Mass Index (BMI) 40.0 and over, adult: Secondary | ICD-10-CM

## 2023-10-31 MED ORDER — AMLODIPINE BESYLATE 5 MG PO TABS: 5.0000 mg | ORAL_TABLET | Freq: Every day | ORAL | 1 refills | Status: DC

## 2023-10-31 MED ORDER — LOSARTAN POTASSIUM 50 MG PO TABS: ORAL_TABLET | ORAL | 1 refills | Status: DC

## 2023-10-31 NOTE — Progress Notes (Signed)
 Patient Office Visit  Assessment & Plan:   Hypertension, unspecified type -     Losartan  Potassium; 1.5 tablets per day  Dispense: 135 tablet; Refill: 1 -     amLODIPine Besylate; Take 1 tablet (5 mg total) by mouth daily.  Dispense: 90 tablet; Refill: 1 -     Comprehensive metabolic panel with GFR  BMI 45.0-49.9, adult (HCC) -     Amb Ref to Medical Weight Management  CKD stage 3a, GFR 45-59 ml/min (HCC) -     Comprehensive metabolic panel with GFR   Assessment and Plan    Hypertension Blood pressure remains elevated despite losartan . Considering amlodipine addition for better control. - Add amlodipine to losartan  regimen. - Encourage adherence to low-salt diet and regular physical activity.  Chronic kidney disease, unspecified Mild renal impairment. Potassium and kidney function need re-evaluation. - Recheck potassium levels and kidney function tests.  Weight management Prefers lifestyle modifications over pharmacological interventions. Referral to weight management clinic considered. - Consult for weight management clinic referral.           No follow-ups on file.   Subjective:     Patient ID: Tammy Huang, female    DOB: [birth year]  Age: 58 y.o. MRN: [id number]  Chief Complaint  Patient presents with   Medical Management of Chronic Issues    HPI Discussed the use of AI scribe software for clinical note transcription with the patient, who gave verbal consent to proceed.  History of Present Illness         Tammy Huang is a 58 year old female with hypertension who presents for blood pressure management.  She has been monitoring her blood pressure at home, with readings ranging from 136 to 160 mmHg, which are higher than those recorded in the clinic. She is currently taking losartan  at a dose of one and a half tablets, which has slightly lowered her blood pressure but it remains elevated. She has not tried other antihypertensive medications.  She  adheres to a low-salt diet and engages in regular physical activity, specifically walking, resulting in a weight loss of approximately four pounds. She feels better overall with these lifestyle changes. No side effects from her current medication regimen.  Earlier this year, she was informed that her kidney function was slightly off. There is no known family history of kidney disease. She does not take any NSAIDs or Goody powder.  She attempted to obtain Ozempic  for weight management but was denied coverage due to not having diabetes. She has decided to focus on lifestyle modifications for weight loss instead. She is open to seeing someone to help her with weight loss.  Results LABS Renal function: mildly impaired (January 2025) Assessment & Plan Hypertension Blood pressure remains elevated despite losartan . Considering amlodipine addition for better control. - Add amlodipine to losartan  regimen. - Encourage adherence to low-salt diet and regular physical activity.  Chronic kidney disease, unspecified Mild renal impairment. Potassium and kidney function need re-evaluation. - Recheck potassium levels and kidney function tests.  Weight management Prefers lifestyle modifications over pharmacological interventions. Referral to weight management clinic considered. - Consult for weight management clinic referral.    The 10-year ASCVD risk score (Arnett DK, et al., 2019) is: 6.3%  Past Medical History:  Diagnosis Date   Anemia    Arthritis    Asthma    Chronic kidney disease 07/12/2023   Hypertension    Past Surgical History:  Procedure Laterality Date   NO PAST SURGERIES  TUBAL LIGATION  03/21/2001   Social History   Tobacco Use   Smoking status: Never   Smokeless tobacco: Never  Substance Use Topics   Alcohol use: Not Currently    Alcohol/week: 3.0 standard drinks of alcohol    Types: 3 Glasses of wine per week   Drug use: No   Family History  Problem Relation Age of  Onset   Epilepsy Mother    Heart disease Father    Hypertension Father    Hypertension Sister    Esophageal cancer Maternal Grandfather    Heart disease Paternal Uncle    Colon cancer Neg Hx    Breast cancer Neg Hx    Uterine cancer Neg Hx    Ovarian cancer Neg Hx    No Known Allergies  ROS    Objective:    BP (!) 140/110   Pulse 82   Temp 98.5 F (36.9 C)   Ht 5\' 2"  (1.575 m)   Wt 251 lb (113.9 kg)   LMP 12/22/2016   SpO2 99%   BMI 45.91 kg/m  BP Readings from Last 3 Encounters:  10/31/23 (!) 140/110  09/26/23 (!) 140/110  09/20/23 (!) 153/98   Wt Readings from Last 3 Encounters:  10/31/23 251 lb (113.9 kg)  09/26/23 251 lb 2 oz (113.9 kg)  09/20/23 251 lb (113.9 kg)    Physical Exam Vitals and nursing note reviewed.  Constitutional:      Appearance: Normal appearance.  HENT:     Head: Normocephalic.     Right Ear: Tympanic membrane, ear canal and external ear normal.     Left Ear: There is impacted cerumen.  Eyes:     Extraocular Movements: Extraocular movements intact.     Conjunctiva/sclera: Conjunctivae normal.     Pupils: Pupils are equal, round, and reactive to light.  Cardiovascular:     Rate and Rhythm: Normal rate and regular rhythm.     Heart sounds: Normal heart sounds.  Pulmonary:     Effort: Pulmonary effort is normal.     Breath sounds: Normal breath sounds.  Musculoskeletal:     Right lower leg: No edema.     Left lower leg: No edema.  Neurological:     General: No focal deficit present.     Mental Status: She is alert and oriented to person, place, and time.  Psychiatric:        Mood and Affect: Mood normal.        Behavior: Behavior normal.        Thought Content: Thought content normal.        Judgment: Judgment normal.      No results found for any visits on 10/31/23.

## 2023-11-01 ENCOUNTER — Ambulatory Visit: Payer: Self-pay | Admitting: Family Medicine

## 2023-11-01 LAB — COMPREHENSIVE METABOLIC PANEL WITH GFR
AG Ratio: 1.3 (calc) (ref 1.0–2.5)
ALT: 16 U/L (ref 6–29)
AST: 16 U/L (ref 10–35)
Albumin: 4.2 g/dL (ref 3.6–5.1)
Alkaline phosphatase (APISO): 97 U/L (ref 37–153)
BUN/Creatinine Ratio: 18 (calc) (ref 6–22)
BUN: 20 mg/dL (ref 7–25)
CO2: 24 mmol/L (ref 20–32)
Calcium: 9.4 mg/dL (ref 8.6–10.4)
Chloride: 106 mmol/L (ref 98–110)
Creat: 1.12 mg/dL — ABNORMAL HIGH (ref 0.50–1.03)
Globulin: 3.3 g/dL (ref 1.9–3.7)
Glucose, Bld: 86 mg/dL (ref 65–99)
Potassium: 4.4 mmol/L (ref 3.5–5.3)
Sodium: 140 mmol/L (ref 135–146)
Total Bilirubin: 0.4 mg/dL (ref 0.2–1.2)
Total Protein: 7.5 g/dL (ref 6.1–8.1)
eGFR: 57 mL/min/{1.73_m2} — ABNORMAL LOW (ref >=60)

## 2023-11-14 ENCOUNTER — Encounter: Payer: Self-pay | Admitting: Family Medicine

## 2023-11-28 ENCOUNTER — Encounter (INDEPENDENT_AMBULATORY_CARE_PROVIDER_SITE_OTHER): Payer: Self-pay

## 2023-11-28 ENCOUNTER — Encounter: Payer: Self-pay | Admitting: Family Medicine

## 2023-11-28 ENCOUNTER — Ambulatory Visit: Admitting: Family Medicine

## 2023-11-28 VITALS — BP 130/80 | HR 73 | Temp 98.7°F | Ht 62.0 in | Wt 248.5 lb

## 2023-11-28 DIAGNOSIS — N289 Disorder of kidney and ureter, unspecified: Secondary | ICD-10-CM | POA: Diagnosis not present

## 2023-11-28 DIAGNOSIS — I10 Essential (primary) hypertension: Secondary | ICD-10-CM

## 2023-11-28 DIAGNOSIS — Z6841 Body Mass Index (BMI) 40.0 and over, adult: Secondary | ICD-10-CM | POA: Diagnosis not present

## 2023-11-28 NOTE — Progress Notes (Signed)
 Patient Office Visit  Assessment & Plan:  Hypertension, unspecified type  BMI 45.0-49.9, adult (HCC)  Abnormal kidney function   Assessment and Plan    Hypertension Hypertension well-controlled with losartan  75 mg. Home readings average 139-140 mmHg systolic. Lifestyle modifications contributing to control. No medication adjustment needed. - Continue losartan  75 mg daily. - Encourage lifestyle modifications: healthy diet, regular exercise. - Monitor blood pressure at home regularly.  Chronic Kidney Disease Chronic kidney disease well-managed. Creatinine stable, no dialysis progression. Nephrology appointment scheduled. NSAIDs avoided. - Attend nephrology appointment on July 9th. - Avoid NSAIDs.  General Health Maintenance Current on mammograms, colonoscopy due 2027. Shingles vaccine scheduled. Previous screenings negative, recent A1c normal. - Receive shingles vaccine at pharmacy.  Follow-up Management and lifestyle changes effective. - Schedule follow-up appointment in 3 months.          No follow-ups on file.   Subjective:    Patient ID: Bascom LITTIE Moats, female    DOB: April 02, 1966  Age: 58 y.o. MRN: [id number]  Chief Complaint  Patient presents with   Medical Management of Chronic Issues    HPI Discussed the use of AI scribe software for clinical note transcription with the patient, who gave verbal consent to proceed.  History of Present Illness        SHAZIA MITCHENER is a 58 year old female with hypertension who presents for blood pressure management and weight management.   Her blood pressure has improved since the last visit. About a week ago, she had an elevated reading of 150/90 mmHg, but her recent home readings have been around 139/140 mmHg range. She feels significantly better with these changes. She is currently taking losartan  at a dose of 75 mg, which was increased from 50 mg and Norvasc  5mg  once per day.   She has made significant lifestyle changes,  including reducing red meat consumption since March, and now primarily eats chicken, fish, [place], fruits, and vegetables. She has been drinking plenty of water and avoiding salt, which she finds challenging but has been successful in doing. She has lost some weight as a result of these changes.  She has an upcoming appointment with a nephrologist on July 9th due to previous concerns about her kidney function. Her kidney function has improved but is not perfect. She has stopped taking Advil or any NSAIDs  In terms of physical activity, she engages in walking for about 20 minutes every day at a good pace, usually around 7:30 PM when it is cooler outside. She works at [employer], which keeps her active, especially during busy times like the Fourth of July. Physical Exam CARDIOVASCULAR: Heart murmur present Results LABS Creatinine (Cr): 1 Assessment & Plan Hypertension Hypertension well-controlled with losartan  75 mg. Home readings average 139-140 mmHg systolic. Lifestyle modifications contributing to control. No medication adjustment needed. - Continue losartan  75 mg daily. - Encourage lifestyle modifications: healthy diet, regular exercise. - Monitor blood pressure at home regularly.  Chronic Kidney Disease, abnormal kidney function  Chronic kidney disease well-managed. Creatinine stable, no dialysis progression. Nephrology appointment scheduled. NSAIDs avoided. - Attend nephrology appointment on July 9th. - Avoid NSAIDs.  General Health Maintenance Current on mammograms, colonoscopy due 2027. Shingles vaccine scheduled. Previous screenings negative, recent A1c normal. - Receive shingles vaccine at pharmacy.  Follow-up Management and lifestyle changes effective. - Schedule follow-up appointment in 3 months.    The 10-year ASCVD risk score (Arnett DK, et al., 2019) is: 5%  Past Medical History:  Diagnosis  Date   Anemia    Arthritis    Asthma    Chronic kidney disease  07/12/2023   Hypertension    Past Surgical History:  Procedure Laterality Date   NO PAST SURGERIES     TUBAL LIGATION  03/21/2001   Social History   Tobacco Use   Smoking status: Never   Smokeless tobacco: Never  Substance Use Topics   Alcohol use: Not Currently    Alcohol/week: 3.0 standard drinks of alcohol    Types: 3 Glasses of wine per week   Drug use: No   Family History  Problem Relation Age of Onset   Epilepsy Mother    Heart disease Father    Hypertension Father    Hypertension Sister    Esophageal cancer Maternal Grandfather    Heart disease Paternal Uncle    Colon cancer Neg Hx    Breast cancer Neg Hx    Uterine cancer Neg Hx    Ovarian cancer Neg Hx    No Known Allergies  ROS    Objective:    BP 130/80   Pulse 73   Temp 98.7 F (37.1 C)   Ht 5' 2 (1.575 m)   Wt 248 lb 8 oz (112.7 kg)   LMP 12/22/2016   SpO2 96%   BMI 45.45 kg/m  BP Readings from Last 3 Encounters:  11/28/23 130/80  10/31/23 (!) 140/110  09/26/23 (!) 140/110   Wt Readings from Last 3 Encounters:  11/28/23 248 lb 8 oz (112.7 kg)  10/31/23 251 lb (113.9 kg)  09/26/23 251 lb 2 oz (113.9 kg)    Physical Exam Vitals and nursing note reviewed.  Constitutional:      Appearance: Normal appearance.  HENT:     Head: Normocephalic.     Right Ear: Tympanic membrane, ear canal and external ear normal.     Left Ear: Tympanic membrane, ear canal and external ear normal.   Eyes:     Extraocular Movements: Extraocular movements intact.     Pupils: Pupils are equal, round, and reactive to light.    Cardiovascular:     Rate and Rhythm: Normal rate and regular rhythm.     Heart sounds: Murmur heard.  Pulmonary:     Effort: Pulmonary effort is normal.     Breath sounds: Normal breath sounds.   Musculoskeletal:     Right lower leg: No edema.     Left lower leg: No edema.   Neurological:     General: No focal deficit present.     Mental Status: She is alert and oriented to  person, place, and time.   Psychiatric:        Mood and Affect: Mood normal.        Behavior: Behavior normal.      No results found for any visits on 11/28/23.

## 2023-12-07 ENCOUNTER — Other Ambulatory Visit: Payer: Self-pay

## 2023-12-07 ENCOUNTER — Encounter (INDEPENDENT_AMBULATORY_CARE_PROVIDER_SITE_OTHER): Payer: Self-pay

## 2023-12-07 DIAGNOSIS — N1831 Chronic kidney disease, stage 3a: Secondary | ICD-10-CM

## 2023-12-09 ENCOUNTER — Ambulatory Visit
Admission: RE | Admit: 2023-12-09 | Discharge: 2023-12-09 | Disposition: A | Discharge status: Home / Self Care | Source: Ambulatory Visit

## 2023-12-09 DIAGNOSIS — N1831 Chronic kidney disease, stage 3a: Secondary | ICD-10-CM

## 2024-02-28 ENCOUNTER — Ambulatory Visit: Admitting: Family Medicine

## 2024-02-28 ENCOUNTER — Encounter: Payer: Self-pay | Admitting: Family Medicine

## 2024-02-28 VITALS — BP 132/88 | HR 68 | Temp 98.5°F | Ht 62.0 in | Wt 242.0 lb

## 2024-02-28 DIAGNOSIS — N1831 Chronic kidney disease, stage 3a: Secondary | ICD-10-CM

## 2024-02-28 DIAGNOSIS — Z23 Encounter for immunization: Secondary | ICD-10-CM

## 2024-02-28 DIAGNOSIS — D508 Other iron deficiency anemias: Secondary | ICD-10-CM | POA: Diagnosis not present

## 2024-02-28 DIAGNOSIS — I10 Essential (primary) hypertension: Secondary | ICD-10-CM

## 2024-02-28 LAB — COMPREHENSIVE METABOLIC PANEL WITH GFR
AG Ratio: 1.4 (calc) (ref 1.0–2.5)
ALT: 14 U/L (ref 6–29)
AST: 16 U/L (ref 10–35)
Albumin: 4.2 g/dL (ref 3.6–5.1)
Alkaline phosphatase (APISO): 102 U/L (ref 37–153)
BUN/Creatinine Ratio: 15 (calc) (ref 6–22)
BUN: 19 mg/dL (ref 7–25)
CO2: 28 mmol/L (ref 20–32)
Calcium: 9.4 mg/dL (ref 8.6–10.4)
Chloride: 106 mmol/L (ref 98–110)
Creat: 1.24 mg/dL — ABNORMAL HIGH (ref 0.50–1.03)
Globulin: 3.1 g/dL (ref 1.9–3.7)
Glucose, Bld: 93 mg/dL (ref 65–99)
Potassium: 5 mmol/L (ref 3.5–5.3)
Sodium: 140 mmol/L (ref 135–146)
Total Bilirubin: 0.3 mg/dL (ref 0.2–1.2)
Total Protein: 7.3 g/dL (ref 6.1–8.1)
eGFR: 50 mL/min/1.73m2 — ABNORMAL LOW (ref >=60)

## 2024-02-28 LAB — CBC WITH DIFFERENTIAL/PLATELET
Absolute Lymphocytes: 2088 {cells}/uL (ref 850–3900)
Absolute Monocytes: 475 {cells}/uL (ref 200–950)
Basophils Absolute: 58 {cells}/uL (ref 0–200)
Basophils Relative: 0.8 %
Eosinophils Absolute: 299 {cells}/uL (ref 15–500)
Eosinophils Relative: 4.1 %
HCT: 36.8 % (ref 35.0–45.0)
Hemoglobin: 11.6 g/dL — ABNORMAL LOW (ref 11.7–15.5)
MCH: 28 pg (ref 27.0–33.0)
MCHC: 31.5 g/dL — ABNORMAL LOW (ref 32.0–36.0)
MCV: 88.9 fL (ref 80.0–100.0)
MPV: 11.1 fL (ref 7.5–12.5)
Monocytes Relative: 6.5 %
Neutro Abs: 4380 {cells}/uL (ref 1500–7800)
Neutrophils Relative %: 60 %
Platelets: 272 Thousand/uL (ref 140–400)
RBC: 4.14 Million/uL (ref 3.80–5.10)
RDW: 12.9 % (ref 11.0–15.0)
Total Lymphocyte: 28.6 %
WBC: 7.3 Thousand/uL (ref 3.8–10.8)

## 2024-02-28 LAB — VITAMIN B12: Vitamin B-12: 307 pg/mL (ref 200–1100)

## 2024-02-28 LAB — VITAMIN D 25 HYDROXY (VIT D DEFICIENCY, FRACTURES): Vit D, 25-Hydroxy: 32 ng/mL (ref 30–100)

## 2024-02-28 LAB — IRON,TIBC AND FERRITIN PANEL
%SAT: 15 % — ABNORMAL LOW (ref 16–45)
Ferritin: 35 ng/mL (ref 16–232)
Iron: 46 ug/dL (ref 45–160)
TIBC: 313 ug/dL (ref 250–450)

## 2024-02-28 LAB — TSH: TSH: 1.9 m[IU]/L (ref 0.40–4.50)

## 2024-02-28 MED ORDER — LOSARTAN POTASSIUM 100 MG PO TABS: 100.0000 mg | ORAL_TABLET | Freq: Every day | ORAL | 1 refills | Status: AC

## 2024-02-28 NOTE — Progress Notes (Signed)
 Patient Office Visit  Assessment & Plan:  Hypertension, unspecified type -     CBC with Differential/Platelet -     TSH -     Losartan  Potassium; Take 1 tablet (100 mg total) by mouth daily.  Dispense: 90 tablet; Refill: 1  CKD stage 3a, GFR 45-59 ml/min (HCC) -     VITAMIN D  25 Hydroxy (Vit-D Deficiency, Fractures)  Immunization due -     Flu vaccine trivalent PF, 6mos and older(Flulaval,Afluria,Fluarix,Fluzone) -     Pneumococcal conjugate vaccine 20-valent  Other iron deficiency anemia -     Vitamin B12 -     Iron, TIBC and Ferritin Panel -     Comprehensive metabolic panel with GFR   Assessment and Plan    Essential hypertension Blood pressure slightly elevated. Losartan  50 mg well-tolerated. Considering dosage increase for better control. Insurance coverage for 100 mg uncertain. - Increase losartan  to 100 mg daily. - Send prescription for losartan  100 mg.  Chronic kidney disease, stage 3a CKD well-managed. Kidney function stable. No recent ultrasound abnormalities. - Continue current management and monitoring.  General Health Maintenance Engaged in lifestyle modifications. Weight loss noted. Plans for vaccinations in place. - Encourage Mediterranean diet and regular physical activity. - Plan shingles vaccine for future visit.          Return in about 3 months (around 05/29/2024), or if symptoms worsen or fail to improve.   Subjective:    Patient ID: Tammy Huang, female    DOB: [birth year]  Age: 58 y.o. MRN: [id number]  Chief Complaint  Patient presents with   Medical Management of Chronic Issues    HPI Discussed the use of AI scribe software for clinical note transcription with the patient, who gave verbal consent to proceed.  History of Present Illness        Tammy Huang is a 58 year old female who presents for a follow-up visit re hypertension, CKD and iron deficiency anemia.    She had a kidney ultrasound on July 11th, 2025 per [facility] in [place], and was told that everything was fine and her kidney function was stable. She reports that she does not need frequent nephrology follow-ups.  She follows a Mediterranean diet, avoiding red meat and fried foods, resulting in weight loss from 251 pounds in June to 242 pounds currently. She engages in physical activity by walking twice a day for 30 minutes each session.  She experiences fatigue on some days and can discern when her iron levels are low. Her iron levels had previously improved, but she is due for a recheck.  She takes losartan  at a dose of one and a half tablets, with blood pressure readings ranging from 132/80 to 142/89.  She increases her water intake, drinking four to five bottles daily, and avoids sweetened beverages. She has not yet received the flu or pneumonia vaccines. Physical Exam MEASUREMENTS: Weight- 242. Results RADIOLOGY Renal ultrasound: Normal (12/09/2023) Assessment & Plan Essential hypertension Blood pressure slightly elevated. Losartan  50 mg well-tolerated. Considering dosage increase for better control. Insurance coverage for 100 mg uncertain. - Increase losartan  to 100 mg daily. - Send prescription for losartan  100 mg.  Chronic kidney disease, stage 3a CKD well-managed. Kidney function stable. No recent ultrasound abnormalities. - Continue current management and monitoring. Continue to avoid NSAIDs  General Health Maintenance Engaged in lifestyle modifications. Weight loss noted. Plans for vaccinations in place. - Encourage Mediterranean diet and regular physical activity. - Plan shingles  vaccine for future visit.     The 10-year ASCVD risk score (Arnett DK, et al., 2019) is: 5.2%  Past Medical History:  Diagnosis Date   Anemia    Arthritis    Asthma    Chronic kidney disease 07/12/2023   Hypertension    Past Surgical History:  Procedure Laterality Date   NO PAST SURGERIES     TUBAL LIGATION  03/21/2001   Social History    Tobacco Use   Smoking status: Never   Smokeless tobacco: Never  Substance Use Topics   Alcohol use: Not Currently    Alcohol/week: 3.0 standard drinks of alcohol    Types: 3 Glasses of wine per week   Drug use: No   Family History  Problem Relation Age of Onset   Epilepsy Mother    Heart disease Father    Hypertension Father    Hypertension Sister    Esophageal cancer Maternal Grandfather    Heart disease Paternal Uncle    Colon cancer Neg Hx    Breast cancer Neg Hx    Uterine cancer Neg Hx    Ovarian cancer Neg Hx    No Known Allergies  ROS    Objective:    BP 132/88   Pulse 68   Temp 98.5 F (36.9 C)   Ht 5' 2 (1.575 m)   Wt 242 lb (109.8 kg)   LMP 12/22/2016   SpO2 98%   BMI 44.26 kg/m  BP Readings from Last 3 Encounters:  02/28/24 132/88  11/28/23 130/80  10/31/23 (!) 140/110   Wt Readings from Last 3 Encounters:  02/28/24 242 lb (109.8 kg)  11/28/23 248 lb 8 oz (112.7 kg)  10/31/23 251 lb (113.9 kg)    Physical Exam Vitals and nursing note reviewed.  Constitutional:      Appearance: Normal appearance.  HENT:     Head: Normocephalic.     Right Ear: Tympanic membrane, ear canal and external ear normal.     Left Ear: Tympanic membrane, ear canal and external ear normal.  Eyes:     Extraocular Movements: Extraocular movements intact.     Conjunctiva/sclera: Conjunctivae normal.     Pupils: Pupils are equal, round, and reactive to light.  Cardiovascular:     Rate and Rhythm: Normal rate and regular rhythm.     Heart sounds: Normal heart sounds.  Pulmonary:     Effort: Pulmonary effort is normal.     Breath sounds: Normal breath sounds.  Musculoskeletal:     Right lower leg: No edema.     Left lower leg: No edema.  Neurological:     General: No focal deficit present.     Mental Status: She is alert and oriented to person, place, and time.  Psychiatric:        Mood and Affect: Mood normal.        Behavior: Behavior normal.        Thought  Content: Thought content normal.        Judgment: Judgment normal.      No results found for any visits on 02/28/24.

## 2024-02-29 ENCOUNTER — Ambulatory Visit: Payer: Self-pay | Admitting: Family Medicine

## 2024-04-24 ENCOUNTER — Encounter: Payer: Self-pay | Admitting: Family Medicine

## 2024-04-24 ENCOUNTER — Other Ambulatory Visit: Payer: Self-pay

## 2024-04-24 DIAGNOSIS — I10 Essential (primary) hypertension: Secondary | ICD-10-CM

## 2024-04-24 MED ORDER — AMLODIPINE BESYLATE 5 MG PO TABS: 5.0000 mg | ORAL_TABLET | Freq: Every day | ORAL | 1 refills | Status: DC

## 2024-04-29 ENCOUNTER — Encounter: Payer: Self-pay | Admitting: Family Medicine

## 2024-05-01 ENCOUNTER — Other Ambulatory Visit: Payer: Self-pay

## 2024-05-01 DIAGNOSIS — I10 Essential (primary) hypertension: Secondary | ICD-10-CM

## 2024-05-01 DIAGNOSIS — N1831 Chronic kidney disease, stage 3a: Secondary | ICD-10-CM

## 2024-06-01 ENCOUNTER — Ambulatory Visit: Admitting: Family Medicine

## 2024-06-01 ENCOUNTER — Encounter: Payer: Self-pay | Admitting: Family Medicine

## 2024-06-01 VITALS — BP 160/120 | HR 64 | Ht 62.0 in | Wt 243.0 lb

## 2024-06-01 DIAGNOSIS — I10 Essential (primary) hypertension: Secondary | ICD-10-CM

## 2024-06-01 DIAGNOSIS — N1831 Chronic kidney disease, stage 3a: Secondary | ICD-10-CM | POA: Diagnosis not present

## 2024-06-01 DIAGNOSIS — D508 Other iron deficiency anemias: Secondary | ICD-10-CM | POA: Diagnosis not present

## 2024-06-01 MED ORDER — AMLODIPINE BESYLATE 5 MG PO TABS: ORAL_TABLET | ORAL | 1 refills | Status: AC

## 2024-06-01 NOTE — Progress Notes (Signed)
 "  Patient Office Visit  Assessment & Plan:  Uncontrolled hypertension -     amLODIPine  Besylate; 1.5 tablets per day (7.5mg /day)  Dispense: 135 tablet; Refill: 1 -     CBC with Differential/Platelet -     Comprehensive metabolic panel with GFR  CKD stage 3a, GFR 45-59 ml/min (HCC) -     Comprehensive metabolic panel with GFR  Other iron deficiency anemia -     Vitamin B12 -     Iron, TIBC and Ferritin Panel   Assessment and Plan    Essential hypertension Blood pressure elevated for two months. Current medications: amlodipine  5 mg, losartan  100 mg. Discussed increasing amlodipine  to 7.5 mg. Potential side effect: ankle swelling. Alternative: losartan  with hydrochlorothiazide, advised on hydration. - Increased amlodipine  to 7.5 mg by taking one and a half of the 5 mg tablets. - Monitor for ankle swelling. - Consider follow-up in 4-6 weeks to assess blood pressure control.  Chronic kidney disease, stage 3a  Iron deficiency anemia Energy levels good, no current symptoms. - Checked iron levels.  General health maintenance Up to date on flu vaccination, mammograms, and colonoscopies. Discussed dietary habits and physical activity. - Encouraged healthy dietary habits and increased physical activity.      Recommend healthy diet i.e mediterranean/DASH diet, consistent exercise - 30 minutes 5 day per week, and gradual weight loss.  Return in about 4 weeks (around 06/29/2024), or if symptoms worsen or fail to improve, for hypertension.   Subjective:    Patient ID: Tammy Huang, female    DOB: 12/22/1965  Age: 59 y.o. MRN: [id number]  Chief Complaint  Patient presents with   Medical Management of Chronic Issues    3 month f/u. No new concerns.     HPI Discussed the use of AI scribe software for clinical note transcription with the patient, who gave verbal consent to proceed.      History of Present Illness Tammy Huang is a 59 year old female with hypertension who presents  for follow-up of elevated blood pressure and CKD Stage 3A  Her blood pressure has been elevated for the past two months. She reports no symptoms such as headache, dizziness, or visual changes. She is currently taking amlodipine  5 mg and losartan  100 mg daily, along with vitamin B12 supplements, and reports no issues with her current medications. No swelling in her ankles.  She mentions an increase in salt intake during the holidays, which may have contributed to her elevated blood pressure. She is working on improving her physical activity and reports doing better with her water intake. She generally cooks at home and identifies snack foods like chips, cookies, and ice cream as her dietary weaknesses.  Her past medical history includes iron deficiency, for which her energy levels have been good. She has received her flu shot and is up to date on her mammograms and colonoscopies, with the next colonoscopy due in 2027.  Assessment and Plan Essential hypertension uncontrolled  Blood pressure elevated for two months. Current medications: amlodipine  5 mg, losartan  100 mg. Discussed increasing amlodipine  to 7.5 mg. Potential side effect: ankle swelling. Alternative: losartan  with hydrochlorothiazide, advised on hydration. - Increased amlodipine  to 7.5 mg by taking one and a half of the 5 mg tablets. - Monitor for ankle swelling. - Consider follow-up in 4-6 weeks to assess blood pressure control.  Chronic kidney disease, stage 3a - patient does see Nephrology  Iron deficiency anemia Energy levels good, no current symptoms. - Checked  iron levels.  General health maintenance Up to date on flu vaccination, mammograms, and colonoscopies. Discussed dietary habits and physical activity. - Encouraged healthy dietary habits and increased physical activity.    The 10-year ASCVD risk score (Arnett DK, et al., 2019) is: 9.4%  Past Medical History:  Diagnosis Date   Anemia    Arthritis    Asthma     Chronic kidney disease 07/12/2023   Hypertension    Past Surgical History:  Procedure Laterality Date   NO PAST SURGERIES     TUBAL LIGATION  03/21/2001   Social History[1] Family History  Problem Relation Age of Onset   Epilepsy Mother    Heart disease Father    Hypertension Father    Hypertension Sister    Esophageal cancer Maternal Grandfather    Heart disease Paternal Uncle    Colon cancer Neg Hx    Breast cancer Neg Hx    Uterine cancer Neg Hx    Ovarian cancer Neg Hx    Allergies[2]  ROS    Objective:    BP (!) 160/120   Pulse 64   Ht 5' 2 (1.575 m)   Wt 243 lb (110.2 kg)   LMP 12/22/2016   SpO2 99%   BMI 44.45 kg/m  BP Readings from Last 3 Encounters:  06/01/24 (!) 160/120  02/28/24 132/88  11/28/23 130/80   Wt Readings from Last 3 Encounters:  06/01/24 243 lb (110.2 kg)  02/28/24 242 lb (109.8 kg)  11/28/23 248 lb 8 oz (112.7 kg)    Physical Exam Vitals and nursing note reviewed.  Constitutional:      General: She is not in acute distress.    Appearance: Normal appearance.  HENT:     Head: Normocephalic.     Right Ear: Tympanic membrane, ear canal and external ear normal.     Left Ear: Tympanic membrane, ear canal and external ear normal.  Eyes:     Extraocular Movements: Extraocular movements intact.     Conjunctiva/sclera: Conjunctivae normal.     Pupils: Pupils are equal, round, and reactive to light.  Cardiovascular:     Rate and Rhythm: Normal rate and regular rhythm.     Heart sounds: Normal heart sounds.  Pulmonary:     Effort: Pulmonary effort is normal.     Breath sounds: Normal breath sounds.  Musculoskeletal:     Right lower leg: No edema.     Left lower leg: No edema.  Neurological:     General: No focal deficit present.     Mental Status: She is alert and oriented to person, place, and time.  Psychiatric:        Mood and Affect: Mood normal.        Behavior: Behavior normal.        Thought Content: Thought content  normal.        Judgment: Judgment normal.      No results found for any visits on 06/01/24.          [1]  Social History Tobacco Use   Smoking status: Never   Smokeless tobacco: Never  Substance Use Topics   Alcohol use: Not Currently    Alcohol/week: 3.0 standard drinks of alcohol    Types: 3 Glasses of wine per week   Drug use: No  [2] No Known Allergies  "

## 2024-06-02 LAB — CBC WITH DIFFERENTIAL/PLATELET
Absolute Lymphocytes: 1653 {cells}/uL (ref 850–3900)
Absolute Monocytes: 331 {cells}/uL (ref 200–950)
Basophils Absolute: 57 {cells}/uL (ref 0–200)
Basophils Relative: 1 %
Eosinophils Absolute: 160 {cells}/uL (ref 15–500)
Eosinophils Relative: 2.8 %
HCT: 40.6 % (ref 35.9–46.0)
Hemoglobin: 12.6 g/dL (ref 11.7–15.5)
MCH: 27.4 pg (ref 27.0–33.0)
MCHC: 31 g/dL — ABNORMAL LOW (ref 31.6–35.4)
MCV: 88.3 fL (ref 81.4–101.7)
MPV: 11.3 fL (ref 7.5–12.5)
Monocytes Relative: 5.8 %
Neutro Abs: 3500 {cells}/uL (ref 1500–7800)
Neutrophils Relative %: 61.4 %
Platelets: 282 Thousand/uL (ref 140–400)
RBC: 4.6 Million/uL (ref 3.80–5.10)
RDW: 13 % (ref 11.0–15.0)
Total Lymphocyte: 29 %
WBC: 5.7 Thousand/uL (ref 3.8–10.8)

## 2024-06-02 LAB — COMPREHENSIVE METABOLIC PANEL WITH GFR
AG Ratio: 1.4 (calc) (ref 1.0–2.5)
ALT: 18 U/L (ref 6–29)
AST: 17 U/L (ref 10–35)
Albumin: 4.5 g/dL (ref 3.6–5.1)
Alkaline phosphatase (APISO): 107 U/L (ref 37–153)
BUN: 20 mg/dL (ref 7–25)
CO2: 27 mmol/L (ref 20–32)
Calcium: 9.6 mg/dL (ref 8.6–10.4)
Chloride: 103 mmol/L (ref 98–110)
Creat: 1.03 mg/dL (ref 0.50–1.03)
Globulin: 3.3 g/dL (ref 1.9–3.7)
Glucose, Bld: 89 mg/dL (ref 65–99)
Potassium: 4.5 mmol/L (ref 3.5–5.3)
Sodium: 140 mmol/L (ref 135–146)
Total Bilirubin: 0.4 mg/dL (ref 0.2–1.2)
Total Protein: 7.8 g/dL (ref 6.1–8.1)
eGFR: 63 mL/min/1.73m2

## 2024-06-02 LAB — IRON,TIBC AND FERRITIN PANEL
%SAT: 23 % (ref 16–45)
Ferritin: 29 ng/mL (ref 16–232)
Iron: 78 ug/dL (ref 45–160)
TIBC: 342 ug/dL (ref 250–450)

## 2024-06-02 LAB — VITAMIN B12: Vitamin B-12: 520 pg/mL (ref 200–1100)

## 2024-06-04 ENCOUNTER — Ambulatory Visit: Payer: Self-pay | Admitting: Family Medicine

## 2024-06-29 ENCOUNTER — Encounter: Payer: Self-pay | Admitting: Family Medicine

## 2024-07-16 ENCOUNTER — Ambulatory Visit: Admitting: Family Medicine
